# Patient Record
Sex: Female | Born: 1965
Health system: Southern US, Community
[De-identification: ages and names within clinical notes are randomized; demographics above are authoritative.]

## PROBLEM LIST (undated history)

## (undated) DIAGNOSIS — F909 Attention-deficit hyperactivity disorder, unspecified type: Secondary | ICD-10-CM

## (undated) DIAGNOSIS — S92902A Unspecified fracture of left foot, initial encounter for closed fracture: Secondary | ICD-10-CM

## (undated) DIAGNOSIS — F419 Anxiety disorder, unspecified: Secondary | ICD-10-CM

## (undated) DIAGNOSIS — F32A Depression, unspecified: Secondary | ICD-10-CM

## (undated) DIAGNOSIS — F329 Major depressive disorder, single episode, unspecified: Secondary | ICD-10-CM

## (undated) DIAGNOSIS — M1990 Unspecified osteoarthritis, unspecified site: Secondary | ICD-10-CM

## (undated) HISTORY — DX: Unspecified fracture of left foot, initial encounter for closed fracture: S92.902A

## (undated) HISTORY — PX: BLADDER SUSPENSION: SHX72

## (undated) HISTORY — PX: EYE SURGERY: SHX253

## (undated) HISTORY — PX: OTHER SURGICAL HISTORY: SHX169

## (undated) HISTORY — PX: BREAST SURGERY: SHX581

## (undated) HISTORY — DX: Attention-deficit hyperactivity disorder, unspecified type: F90.9

## (undated) HISTORY — DX: Depression, unspecified: F32.A

---
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
## 1997-08-12 ENCOUNTER — Other Ambulatory Visit: Admission: RE | Admit: 1997-08-12 | Discharge: 1997-08-12 | Payer: Self-pay | Admitting: Obstetrics and Gynecology

## 1998-08-19 ENCOUNTER — Other Ambulatory Visit: Admission: RE | Admit: 1998-08-19 | Discharge: 1998-08-19 | Payer: Self-pay | Admitting: Obstetrics and Gynecology

## 1999-08-26 ENCOUNTER — Other Ambulatory Visit: Admission: RE | Admit: 1999-08-26 | Discharge: 1999-08-26 | Payer: Self-pay | Admitting: Obstetrics and Gynecology

## 1999-12-01 ENCOUNTER — Encounter: Payer: Self-pay | Admitting: Family Medicine

## 1999-12-01 ENCOUNTER — Encounter: Admission: RE | Admit: 1999-12-01 | Discharge: 1999-12-01 | Payer: Self-pay | Admitting: Family Medicine

## 2000-08-30 ENCOUNTER — Other Ambulatory Visit: Admission: RE | Admit: 2000-08-30 | Discharge: 2000-08-30 | Payer: Self-pay | Admitting: Obstetrics and Gynecology

## 2001-08-23 ENCOUNTER — Other Ambulatory Visit: Admission: RE | Admit: 2001-08-23 | Discharge: 2001-08-23 | Payer: Self-pay | Admitting: Obstetrics and Gynecology

## 2005-04-20 ENCOUNTER — Encounter: Admission: RE | Admit: 2005-04-20 | Discharge: 2005-04-20 | Payer: Self-pay | Admitting: Obstetrics and Gynecology

## 2006-05-03 ENCOUNTER — Encounter: Admission: RE | Admit: 2006-05-03 | Discharge: 2006-05-03 | Payer: Self-pay | Admitting: Obstetrics and Gynecology

## 2007-05-22 ENCOUNTER — Encounter: Admission: RE | Admit: 2007-05-22 | Discharge: 2007-05-22 | Payer: Self-pay | Admitting: Obstetrics and Gynecology

## 2008-06-24 ENCOUNTER — Encounter: Admission: RE | Admit: 2008-06-24 | Discharge: 2008-06-24 | Payer: Self-pay | Admitting: Obstetrics and Gynecology

## 2009-07-17 ENCOUNTER — Encounter: Admission: RE | Admit: 2009-07-17 | Discharge: 2009-07-17 | Payer: Self-pay | Admitting: Obstetrics and Gynecology

## 2010-08-04 ENCOUNTER — Other Ambulatory Visit: Payer: Self-pay | Admitting: Obstetrics and Gynecology

## 2010-08-04 DIAGNOSIS — Z1231 Encounter for screening mammogram for malignant neoplasm of breast: Secondary | ICD-10-CM

## 2010-08-07 ENCOUNTER — Ambulatory Visit
Admission: RE | Admit: 2010-08-07 | Discharge: 2010-08-07 | Disposition: A | Discharge status: Home / Self Care | Payer: 59 | Source: Ambulatory Visit | Attending: Obstetrics and Gynecology | Admitting: Obstetrics and Gynecology

## 2010-08-07 DIAGNOSIS — Z1231 Encounter for screening mammogram for malignant neoplasm of breast: Secondary | ICD-10-CM

## 2010-12-01 ENCOUNTER — Ambulatory Visit: Admit: 2010-12-01 | Payer: Self-pay | Admitting: Obstetrics and Gynecology

## 2010-12-01 SURGERY — URETHROPEXY, USING TRANSVAGINAL TAPE

## 2011-01-12 ENCOUNTER — Ambulatory Visit (HOSPITAL_COMMUNITY): Admission: RE | Admit: 2011-01-12 | Payer: Self-pay | Source: Ambulatory Visit | Admitting: Obstetrics and Gynecology

## 2011-01-12 ENCOUNTER — Encounter (HOSPITAL_COMMUNITY): Admission: RE | Payer: Self-pay | Source: Ambulatory Visit

## 2011-01-12 SURGERY — URETHROPEXY, USING TRANSVAGINAL TAPE
Anesthesia: Choice

## 2011-06-22 ENCOUNTER — Encounter (HOSPITAL_COMMUNITY): Payer: Self-pay | Admitting: *Deleted

## 2011-06-30 ENCOUNTER — Other Ambulatory Visit: Payer: Self-pay | Admitting: Obstetrics and Gynecology

## 2011-07-21 ENCOUNTER — Encounter (HOSPITAL_COMMUNITY): Payer: Self-pay | Admitting: Pharmacist

## 2011-08-03 MED ORDER — CEFOTETAN DISODIUM 2 G IJ SOLR
2.0000 g | INTRAMUSCULAR | Status: AC
  Administered 2011-08-04: 2 g via INTRAVENOUS
  Filled 2011-08-03: qty 2

## 2011-08-03 NOTE — H&P (Signed)
46 y.o. yo complains of SUI.  Leaks with exercise and now with sneeze and cough.  Urine will run down leg.  She has occ urge but it is associated with increased H2O intake.  She does not need to splint.  Past Medical History  Diagnosis Date  . Arthritis     hands - tx with otc meds prn  . Anxiety    Past Surgical History  Procedure Date  . Cesarean section 1995  . Breast surgery     Right breast lumpectomy  . Wisom teeth ext   . Endosocpy     History   Social History  . Marital Status: Single    Spouse Name: N/A    Number of Children: N/A  . Years of Education: N/A   Occupational History  . Not on file.   Social History Main Topics  . Smoking status: Former Smoker -- 0.2 packs/day for 8 years    Types: Cigarettes    Quit date: 09/10/1992  . Smokeless tobacco: Never Used  . Alcohol Use: Yes     socially  . Drug Use: No  . Sexually Active: Not Currently    Birth Control/ Protection: None   Other Topics Concern  . Not on file   Social History Narrative  . No narrative on file    No current facility-administered medications on file prior to encounter.   Current Outpatient Prescriptions on File Prior to Encounter  Medication Sig Dispense Refill  . venlafaxine (EFFEXOR) 50 MG tablet Take 50 mg by mouth 2 (two) times daily.        No Known Allergies  Vital P  Lungs: clear to ascultation Cor:  RRR Abdomen:  soft, nontender, nondistended. Ex:  no cords, erythema Pelvic:  NEFG. Normal s/s uterus, 7 weeks mid.  No decensus.  Cystocele -3, Rectocele -3, urethra >45 degrees.  Cystometrics:  LPP 126, detrusor stable, normal flow and compliance.  A:  SUI- desires TVT.  Possible Arepair only if necessary. Pt is perimenopausal without period abnormalities and no decensus.  She desires to keep her uterus.   P:  All risks, benefits and alternatives d/w patient and she desires to proceed.  Patient will receive preop antibiotics and SCDs during the operation.      Alicia Seib A

## 2011-08-04 ENCOUNTER — Ambulatory Visit (HOSPITAL_COMMUNITY): Payer: 59 | Admitting: Anesthesiology

## 2011-08-04 ENCOUNTER — Encounter (HOSPITAL_COMMUNITY)
Admission: RE | Disposition: A | Discharge status: Home / Self Care | Payer: Self-pay | Source: Ambulatory Visit | Attending: Obstetrics and Gynecology

## 2011-08-04 ENCOUNTER — Encounter (HOSPITAL_COMMUNITY): Payer: Self-pay | Admitting: Anesthesiology

## 2011-08-04 ENCOUNTER — Ambulatory Visit (HOSPITAL_COMMUNITY)
Admission: RE | Admit: 2011-08-04 | Discharge: 2011-08-04 | Disposition: A | Discharge status: Home / Self Care | Payer: 59 | Source: Ambulatory Visit | Attending: Obstetrics and Gynecology | Admitting: Obstetrics and Gynecology

## 2011-08-04 DIAGNOSIS — N8111 Cystocele, midline: Secondary | ICD-10-CM | POA: Insufficient documentation

## 2011-08-04 DIAGNOSIS — N393 Stress incontinence (female) (male): Secondary | ICD-10-CM | POA: Insufficient documentation

## 2011-08-04 DIAGNOSIS — Z9889 Other specified postprocedural states: Secondary | ICD-10-CM

## 2011-08-04 HISTORY — PX: BLADDER SUSPENSION: SHX72

## 2011-08-04 HISTORY — DX: Unspecified osteoarthritis, unspecified site: M19.90

## 2011-08-04 HISTORY — PX: CYSTOSCOPY: SHX5120

## 2011-08-04 HISTORY — DX: Anxiety disorder, unspecified: F41.9

## 2011-08-04 LAB — CBC
MCV: 101 fL — ABNORMAL HIGH (ref 78.0–100.0)
Platelets: 340 10*3/uL (ref 150–400)
RBC: 4.06 MIL/uL (ref 3.87–5.11)
RDW: 13.5 % (ref 11.5–15.5)
WBC: 5.5 10*3/uL (ref 4.0–10.5)

## 2011-08-04 SURGERY — URETHROPEXY, USING TRANSVAGINAL TAPE
Anesthesia: General | Site: Vagina | Wound class: Clean Contaminated

## 2011-08-04 MED ORDER — EPHEDRINE 5 MG/ML INJ
INTRAVENOUS | Status: AC
  Filled 2011-08-04: qty 10

## 2011-08-04 MED ORDER — DEXAMETHASONE SODIUM PHOSPHATE 4 MG/ML IJ SOLN
INTRAMUSCULAR | Status: DC | PRN
  Administered 2011-08-04: 10 mg via INTRAVENOUS

## 2011-08-04 MED ORDER — ESTRADIOL 0.1 MG/GM VA CREA
TOPICAL_CREAM | VAGINAL | Status: AC
  Filled 2011-08-04: qty 42.5

## 2011-08-04 MED ORDER — DEXAMETHASONE SODIUM PHOSPHATE 10 MG/ML IJ SOLN
INTRAMUSCULAR | Status: AC
  Filled 2011-08-04: qty 1

## 2011-08-04 MED ORDER — FENTANYL CITRATE 0.05 MG/ML IJ SOLN
25.0000 ug | INTRAMUSCULAR | Status: DC | PRN
  Administered 2011-08-04: 50 ug via INTRAVENOUS
  Administered 2011-08-04: 25 ug via INTRAVENOUS

## 2011-08-04 MED ORDER — FENTANYL CITRATE 0.05 MG/ML IJ SOLN
INTRAMUSCULAR | Status: DC | PRN
  Administered 2011-08-04: 25 ug via INTRAVENOUS
  Administered 2011-08-04: 100 ug via INTRAVENOUS
  Administered 2011-08-04: 75 ug via INTRAVENOUS

## 2011-08-04 MED ORDER — LIDOCAINE HCL (CARDIAC) 20 MG/ML IV SOLN
INTRAVENOUS | Status: AC
  Filled 2011-08-04: qty 5

## 2011-08-04 MED ORDER — OXYCODONE-ACETAMINOPHEN 5-325 MG PO TABS
ORAL_TABLET | ORAL | Status: AC
  Filled 2011-08-04: qty 1

## 2011-08-04 MED ORDER — LIDOCAINE-EPINEPHRINE 0.5-1:200000 % IJ SOLN
INTRAMUSCULAR | Status: AC
  Filled 2011-08-04: qty 1

## 2011-08-04 MED ORDER — PROPOFOL 10 MG/ML IV EMUL
INTRAVENOUS | Status: AC
  Filled 2011-08-04: qty 20

## 2011-08-04 MED ORDER — MEPERIDINE HCL 25 MG/ML IJ SOLN: 6.2500 mg | INTRAMUSCULAR | Status: DC | PRN

## 2011-08-04 MED ORDER — GLYCOPYRROLATE 0.2 MG/ML IJ SOLN
INTRAMUSCULAR | Status: AC
  Filled 2011-08-04: qty 1

## 2011-08-04 MED ORDER — LACTATED RINGERS IV SOLN
INTRAVENOUS | Status: DC
  Administered 2011-08-04 (×2): via INTRAVENOUS

## 2011-08-04 MED ORDER — PROPOFOL 10 MG/ML IV EMUL
INTRAVENOUS | Status: DC | PRN
  Administered 2011-08-04: 200 mg via INTRAVENOUS

## 2011-08-04 MED ORDER — ONDANSETRON HCL 4 MG/2ML IJ SOLN
INTRAMUSCULAR | Status: AC
  Filled 2011-08-04: qty 2

## 2011-08-04 MED ORDER — KETOROLAC TROMETHAMINE 30 MG/ML IJ SOLN
INTRAMUSCULAR | Status: DC | PRN
  Administered 2011-08-04: 30 mg via INTRAVENOUS

## 2011-08-04 MED ORDER — OXYCODONE-ACETAMINOPHEN 5-325 MG PO TABS
ORAL_TABLET | ORAL | Status: AC
  Administered 2011-08-04: 1 via ORAL
  Filled 2011-08-04: qty 1

## 2011-08-04 MED ORDER — FENTANYL CITRATE 0.05 MG/ML IJ SOLN
INTRAMUSCULAR | Status: AC
  Filled 2011-08-04: qty 4

## 2011-08-04 MED ORDER — MIDAZOLAM HCL 5 MG/5ML IJ SOLN
INTRAMUSCULAR | Status: DC | PRN
  Administered 2011-08-04: 2 mg via INTRAVENOUS

## 2011-08-04 MED ORDER — STERILE WATER FOR IRRIGATION IR SOLN
Status: DC | PRN
  Administered 2011-08-04: 1000 mL

## 2011-08-04 MED ORDER — KETOROLAC TROMETHAMINE 30 MG/ML IJ SOLN: 15.0000 mg | Freq: Once | INTRAMUSCULAR | Status: DC | PRN

## 2011-08-04 MED ORDER — ESTRADIOL 0.1 MG/GM VA CREA
TOPICAL_CREAM | VAGINAL | Status: DC | PRN
  Administered 2011-08-04: 1 via VAGINAL

## 2011-08-04 MED ORDER — FENTANYL CITRATE 0.05 MG/ML IJ SOLN
INTRAMUSCULAR | Status: AC
  Administered 2011-08-04: 50 ug via INTRAVENOUS
  Filled 2011-08-04: qty 2

## 2011-08-04 MED ORDER — MIDAZOLAM HCL 2 MG/2ML IJ SOLN
INTRAMUSCULAR | Status: AC
  Filled 2011-08-04: qty 2

## 2011-08-04 MED ORDER — EPHEDRINE SULFATE 50 MG/ML IJ SOLN
INTRAMUSCULAR | Status: DC | PRN
  Administered 2011-08-04: 5 mg via INTRAVENOUS

## 2011-08-04 MED ORDER — INDIGOTINDISULFONATE SODIUM 8 MG/ML IJ SOLN
INTRAMUSCULAR | Status: DC | PRN
  Administered 2011-08-04: 40 mg via INTRAVENOUS

## 2011-08-04 MED ORDER — ONDANSETRON HCL 4 MG/2ML IJ SOLN: 4.0000 mg | Freq: Once | INTRAMUSCULAR | Status: DC | PRN

## 2011-08-04 MED ORDER — OXYCODONE-ACETAMINOPHEN 5-325 MG PO TABS
1.0000 | ORAL_TABLET | Freq: Once | ORAL | Status: AC
  Administered 2011-08-04: 1 via ORAL

## 2011-08-04 MED ORDER — OXYCODONE-ACETAMINOPHEN 5-325 MG PO TABS: 1.0000 | ORAL_TABLET | ORAL | Status: AC | PRN

## 2011-08-04 MED ORDER — LIDOCAINE-EPINEPHRINE 0.5-1:200000 % IJ SOLN
INTRAMUSCULAR | Status: DC | PRN
  Administered 2011-08-04: 7 mL

## 2011-08-04 MED ORDER — CEFUROXIME AXETIL 250 MG PO TABS: 250.0000 mg | ORAL_TABLET | Freq: Two times a day (BID) | ORAL | Status: AC

## 2011-08-04 MED ORDER — FENTANYL CITRATE 0.05 MG/ML IJ SOLN
INTRAMUSCULAR | Status: AC
  Filled 2011-08-04: qty 2

## 2011-08-04 MED ORDER — PHENAZOPYRIDINE HCL 200 MG PO TABS: 200.0000 mg | ORAL_TABLET | Freq: Three times a day (TID) | ORAL | Status: AC | PRN

## 2011-08-04 MED ORDER — INDIGOTINDISULFONATE SODIUM 8 MG/ML IJ SOLN
INTRAMUSCULAR | Status: AC
  Filled 2011-08-04: qty 5

## 2011-08-04 MED ORDER — ONDANSETRON HCL 4 MG/2ML IJ SOLN
INTRAMUSCULAR | Status: DC | PRN
  Administered 2011-08-04: 4 mg via INTRAVENOUS

## 2011-08-04 SURGICAL SUPPLY — 29 items
ADH SKN CLS APL DERMABOND .7 (GAUZE/BANDAGES/DRESSINGS) ×2
BLADE SURG 15 STRL LF C SS BP (BLADE) ×2 IMPLANT
BLADE SURG 15 STRL SS (BLADE) ×3
CANISTER SUCTION 2500CC (MISCELLANEOUS) ×4 IMPLANT
CATH BONANNO SUPRAPUBIC 14G (CATHETERS) IMPLANT
CLOTH BEACON ORANGE TIMEOUT ST (SAFETY) ×3 IMPLANT
DECANTER SPIKE VIAL GLASS SM (MISCELLANEOUS) ×1 IMPLANT
DERMABOND ADVANCED (GAUZE/BANDAGES/DRESSINGS) ×1
DERMABOND ADVANCED .7 DNX12 (GAUZE/BANDAGES/DRESSINGS) ×2 IMPLANT
GAUZE PACKING 2X5 YD STERILE (GAUZE/BANDAGES/DRESSINGS) ×3 IMPLANT
GLOVE BIO SURGEON STRL SZ7 (GLOVE) ×6 IMPLANT
GOWN PREVENTION PLUS LG XLONG (DISPOSABLE) ×12 IMPLANT
NEEDLE HYPO 22GX1.5 SAFETY (NEEDLE) ×3 IMPLANT
NS IRRIG 1000ML POUR BTL (IV SOLUTION) ×3 IMPLANT
PACK VAGINAL WOMENS (CUSTOM PROCEDURE TRAY) ×3 IMPLANT
PLUG CATH AND CAP STER (CATHETERS) ×3 IMPLANT
SET CYSTO W/LG BORE CLAMP LF (SET/KITS/TRAYS/PACK) ×3 IMPLANT
SLING TRANS VAGINAL TAPE (Sling) ×1 IMPLANT
SLING UTERINE/ABD GYNECARE TVT (Sling) ×2 IMPLANT
SPONGE SURGIFOAM ABS GEL 12-7 (HEMOSTASIS) ×2 IMPLANT
SUT VIC AB 0 CT1 27 (SUTURE) ×6
SUT VIC AB 0 CT1 27XBRD ANBCTR (SUTURE) ×4 IMPLANT
SUT VIC AB 2-0 SH 27 (SUTURE) ×9
SUT VIC AB 2-0 SH 27XBRD (SUTURE) ×6 IMPLANT
SUT VIC AB 2-0 UR6 27 (SUTURE) IMPLANT
SYR 50ML LL SCALE MARK (SYRINGE) IMPLANT
TOWEL OR 17X24 6PK STRL BLUE (TOWEL DISPOSABLE) ×6 IMPLANT
TRAY FOLEY CATH 14FR (SET/KITS/TRAYS/PACK) ×3 IMPLANT
WATER STERILE IRR 1000ML POUR (IV SOLUTION) ×3 IMPLANT

## 2011-08-04 NOTE — Transfer of Care (Signed)
Immediate Anesthesia Transfer of Care Note  Patient: Chelsea Velez  Procedure(s) Performed: Procedure(s) (LRB): TRANSVAGINAL TAPE (TVT) PROCEDURE (N/A) CYSTOSCOPY (N/A)  Patient Location: PACU  Anesthesia Type: General  Level of Consciousness: awake, alert  and oriented  Airway & Oxygen Therapy: Patient Spontanous Breathing and Patient connected to nasal cannula oxygen  Post-op Assessment: Report given to PACU RN and Post -op Vital signs reviewed and stable  Post vital signs: Reviewed and stable  Complications: No apparent anesthesia complications

## 2011-08-04 NOTE — Anesthesia Postprocedure Evaluation (Signed)
Anesthesia Post Note  Patient: Chelsea Velez  Procedure(s) Performed: Procedure(s) (LRB): TRANSVAGINAL TAPE (TVT) PROCEDURE (N/A) CYSTOSCOPY (N/A)  Anesthesia type: General  Patient location: PACU  Post pain: Pain level controlled  Post assessment: Post-op Vital signs reviewed  Last Vitals:  Filed Vitals:   08/04/11 1415  BP: 114/58  Pulse: 83  Temp:   Resp: 16    Post vital signs: Reviewed  Level of consciousness: sedated  Complications: No apparent anesthesia complicationsfj

## 2011-08-04 NOTE — Op Note (Signed)
08/04/2011  1:40 PM  PATIENT:  Chelsea Velez  46 y.o. female  PRE-OPERATIVE DIAGNOSIS:  Stress Urinary Incontinence, cystocele  POST-OPERATIVE DIAGNOSIS:  Stress Urinary Incontinence, cystocele  PROCEDURE:  Procedure(s) (LRB): TRANSVAGINAL TAPE (TVT) PROCEDURE (N/A), Anterior repair, CYSTOSCOPY (N/A)  SURGEON:  Surgeon(s) and Role:    * Loney Laurence, MD - Primary    * Miguel Aschoff, MD - Assisting  ANESTHESIA:   general  EBL:  Total I/O In: 1000 [I.V.:1000] Out: -   LOCAL MEDICATIONS USED:  LIDOCAINE   COUNTS:  YES  PLAN OF CARE: Discharge to home after PACU  PATIENT DISPOSITION:  PACU - hemodynamically stable.   Delay start of Pharmacological VTE agent (>24hrs) due to surgical blood loss or risk of bleeding: not applicable  Findings: Cystocele -1, uterus is well suspended.  Good spill of indigo carmine from bilateral ureteral orifices and the urethra was clear.  Complications:  Needles bilaterally punctured the bladder on the first pass by cystoscopy.  There were no needles in the bladder on the second pass and the puncture sites were hemostatic.    Meds:  Indigo carmine, lidocaine with epi and estrace cream.  After general anesthesia was administered, the patient was prepped and draped in the usual sterile fashion.  The uterus was well suspended and we elected to leave it in place.  A foley was placed in the urethra and the apex of the cystocele grasped with two allises.  Allises were used to grasp the vaginal mucosa in the midline superior to inferior just below the urethral opening.  The mucosa was injected with 1% lidocaine with epi in the midline and a scalpel used to incise the vaginal mucosa vertically.  Allises were used to retract the vaginal mucosa as the vesico-vaginal fascia was removed from the mucosa with blunt and sharp dissection and adequate room midurethral to pelvic floor bilaterally was developed.  Two small puncture incisions were then made two cm  from midline just above the pubic symphysis.  The abdominal needles from the [employer]ne[employer] TVT set were introduced behind the pubic bone, through the pelvic floor and out the vagina carefully on each respective side, being careful to hug the posterior of the pubic bone.  The foley was removed and indigo carmine given.  Cystoscopy revealed that the dome of the bladder was punctured by both needles and the needles were removed.  The abdominal needles were then replaced and cystoscopy performed again.  This time excellent bilateral spill from each ureteral orifice was seen and there were NO NEEDLES IN THE BLADDER.   The urethra was clear as well.  The foley was replaced and the vaginal needles attached to the abdominal needles.  The tape was pulled into place through the abdominal incisions, the sheaths removed while a kelly was in place under the mesh sling and the tape cut at the level of just below the skin.  The tape was checked and found to be tight enough and laying flat.   Gelfoam  was placed in the corners up by the pelvic floor to achieve hemostasis of some small bleeders out of reach and too close to the sling for stitches.  Once hemostasis was achieved, two mattress stitches of 0-vicryl were placed to close the vesico-vaginal fascia under the bladder.  The vaginal mucosa was trimmed and then closed with a running lock stitch of 2-0 vicryl.  A vaginal packing with estrace was placed and the patient returned to the recovery room in stable  condition.     The family and the patient were notified about the bladder puncture and the need for keeping the foley in place for at least one week.  Addison Whidbee A

## 2011-08-04 NOTE — Progress Notes (Signed)
Patient to be discharged with foley in place. Instructions given how to change big bag to leg bag and leg bag to big bag with understanding. Instructions given with understanding how to empty big bag. patient understands asceptic technique.

## 2011-08-04 NOTE — Anesthesia Procedure Notes (Signed)
Procedure Name: LMA Insertion Date/Time: 08/04/2011 12:43 PM Performed by: Joceline Hinchcliff, Jannet Askew Pre-anesthesia Checklist: Patient identified, Patient being monitored, Emergency Drugs available, Timeout performed and Suction available Patient Re-evaluated:Patient Re-evaluated prior to inductionOxygen Delivery Method: Circle system utilized Preoxygenation: Pre-oxygenation with 100% oxygen Intubation Type: IV induction Ventilation: Mask ventilation without difficulty LMA: LMA inserted LMA Size: 4.0 Number of attempts: 1 Placement Confirmation: positive ETCO2 and breath sounds checked- equal and bilateral Tube secured with: Tape (at lips)

## 2011-08-04 NOTE — Progress Notes (Signed)
There has been no change in the patients history, status or exam since the history and physical.  Filed Vitals:   06/22/11 0954 08/04/11 1048  BP:  121/54  Pulse:  83  Temp:  98.2 F (36.8 C)  TempSrc:  Oral  Resp:  18  Height: 5\' 7"  (1.702 m)   Weight: 56.7 kg (125 lb)   SpO2:  99%    Lab Results  Component Value Date   WBC 5.5 08/04/2011   HGB 13.4 08/04/2011   HCT 41.0 08/04/2011   MCV 101.0* 08/04/2011   PLT 340 08/04/2011    Ja Pistole A

## 2011-08-04 NOTE — Anesthesia Preprocedure Evaluation (Signed)
Anesthesia Evaluation  Patient identified by MRN, date of birth, ID band Patient awake    Reviewed: Allergy & Precautions, H&P , NPO status , Patient's Chart, lab work & pertinent test results  Airway Mallampati: I TM Distance: >3 FB Neck ROM: full    Dental No notable dental hx. (+) Teeth Intact   Pulmonary neg pulmonary ROS,    Pulmonary exam normal       Cardiovascular negative cardio ROS      Neuro/Psych negative neurological ROS  negative psych ROS   GI/Hepatic negative GI ROS, Neg liver ROS,   Endo/Other  negative endocrine ROS  Renal/GU negative Renal ROS  negative genitourinary   Musculoskeletal negative musculoskeletal ROS (+)   Abdominal Normal abdominal exam  (+)   Peds negative pediatric ROS (+)  Hematology negative hematology ROS (+)   Anesthesia Other Findings   Reproductive/Obstetrics negative OB ROS                           Anesthesia Physical Anesthesia Plan  ASA: II  Anesthesia Plan: General   Post-op Pain Management:    Induction: Intravenous  Airway Management Planned: LMA  Additional Equipment:   Intra-op Plan:   Post-operative Plan:   Informed Consent: I have reviewed the patients History and Physical, chart, labs and discussed the procedure including the risks, benefits and alternatives for the proposed anesthesia with the patient or authorized representative who has indicated his/her understanding and acceptance.     Plan Discussed with: CRNA and Surgeon  Anesthesia Plan Comments:         Anesthesia Quick Evaluation  

## 2011-08-04 NOTE — Brief Op Note (Signed)
08/04/2011  1:40 PM  PATIENT:  Chelsea Velez  46 y.o. female  PRE-OPERATIVE DIAGNOSIS:  Stress Urinary Incontinence, cystocele  POST-OPERATIVE DIAGNOSIS:  Stress Urinary Incontinence, cystocele  PROCEDURE:  Procedure(s) (LRB): TRANSVAGINAL TAPE (TVT) PROCEDURE (N/Velez), Anterior repair, CYSTOSCOPY (N/Velez)  SURGEON:  Surgeon(s) and Role:    * Loney Laurence, MD - Primary    * Miguel Aschoff, MD - Assisting  ANESTHESIA:   general  EBL:  Total I/O In: 1000 [I.V.:1000] Out: -   LOCAL MEDICATIONS USED:  LIDOCAINE   COUNTS:  YES  PLAN OF CARE: Discharge to home after PACU  PATIENT DISPOSITION:  PACU - hemodynamically stable.   Delay start of Pharmacological VTE agent (>24hrs) due to surgical blood loss or risk of bleeding: not applicable  Findings: Cystocele -1, uterus is well suspended.  Good spill of indigo carmine from bilateral ureteral orifices and the urethra was clear.  Complications:  Needles bilaterally punctured the bladder on the first pass by cystoscopy.  There were no needles in the bladder on the second pass and the puncture sites were hemostatic.    Meds:  Indigo carmine, lidocaine with epi and estrace cream.  After general anesthesia was administered, the patient was prepped and draped in the usual sterile fashion.  The uterus was well suspended and we elected to leave it in place.  Velez foley was placed in the urethra and the apex of the cystocele grasped with two allises.  Allises were used to grasp the vaginal mucosa in the midline superior to inferior just below the urethral opening.  The mucosa was injected with 1% lidocaine with epi in the midline and Velez scalpel used to incise the vaginal mucosa vertically.  Allises were used to retract the vaginal mucosa as the vesico-vaginal fascia was removed from the mucosa with blunt and sharp dissection and adequate room midurethral to pelvic floor bilaterally was developed.  Two small puncture incisions were then made two cm  from midline just above the pubic symphysis.  The abdominal needles from the Gynecare TVT set were introduced behind the pubic bone, through the pelvic floor and out the vagina carefully on each respective side, being careful to hug the posterior of the pubic bone.  The foley was removed and indigo carmine given.  Cystoscopy revealed that the dome of the bladder was punctured by both needles and the needles were removed.  The abdominal needles were then replaced and cystoscopy performed again.  This time excellent bilateral spill from each ureteral orifice was seen and there were NO NEEDLES IN THE BLADDER.   The urethra was clear as well.  The foley was replaced and the vaginal needles attached to the abdominal needles.  The tape was pulled into place through the abdominal incisions, the sheaths removed while Velez kelly was in place under the mesh sling and the tape cut at the level of just below the skin.  The tape was checked and found to be tight enough and laying flat.   Gelfoam  was placed in the corners up by the pelvic floor to achieve hemostasis of some small bleeders out of reach and too close to the sling for stitches.  Once hemostasis was achieved, two mattress stitches of 0-vicryl were placed to close the vesico-vaginal fascia under the bladder.  The vaginal mucosa was trimmed and then closed with Velez running lock stitch of 2-0 vicryl.  Velez vaginal packing with estrace was placed and the patient returned to the recovery room in stable  condition.     Chelsea Velez

## 2011-08-04 NOTE — Discharge Instructions (Signed)
Will need to keep foley in place for at least one week.  Take pyridium and antibiotics for that time.  Show patient how to change leg bag for big bag.  Will need to return to the office tomorrow am FOR REMOVAL OF VAG PACK.

## 2011-08-09 ENCOUNTER — Encounter (HOSPITAL_COMMUNITY): Payer: Self-pay | Admitting: Obstetrics and Gynecology

## 2011-10-18 ENCOUNTER — Other Ambulatory Visit: Payer: Self-pay | Admitting: Obstetrics and Gynecology

## 2011-10-18 DIAGNOSIS — Z1231 Encounter for screening mammogram for malignant neoplasm of breast: Secondary | ICD-10-CM

## 2011-11-01 ENCOUNTER — Ambulatory Visit
Admission: RE | Admit: 2011-11-01 | Discharge: 2011-11-01 | Disposition: A | Discharge status: Home / Self Care | Payer: 59 | Source: Ambulatory Visit | Attending: Obstetrics and Gynecology | Admitting: Obstetrics and Gynecology

## 2011-11-01 DIAGNOSIS — Z1231 Encounter for screening mammogram for malignant neoplasm of breast: Secondary | ICD-10-CM

## 2012-07-26 ENCOUNTER — Ambulatory Visit
Admission: RE | Admit: 2012-07-26 | Discharge: 2012-07-26 | Disposition: A | Discharge status: Home / Self Care | Payer: 59 | Source: Ambulatory Visit | Attending: Family Medicine | Admitting: Family Medicine

## 2012-07-26 ENCOUNTER — Other Ambulatory Visit: Payer: Self-pay | Admitting: Family Medicine

## 2012-07-26 DIAGNOSIS — R05 Cough: Secondary | ICD-10-CM

## 2012-10-11 ENCOUNTER — Other Ambulatory Visit: Payer: Self-pay

## 2012-10-11 DIAGNOSIS — Z1231 Encounter for screening mammogram for malignant neoplasm of breast: Secondary | ICD-10-CM

## 2012-11-30 ENCOUNTER — Ambulatory Visit
Admission: RE | Admit: 2012-11-30 | Discharge: 2012-11-30 | Disposition: A | Discharge status: Home / Self Care | Payer: 59 | Source: Ambulatory Visit

## 2012-11-30 DIAGNOSIS — Z1231 Encounter for screening mammogram for malignant neoplasm of breast: Secondary | ICD-10-CM

## 2014-02-05 ENCOUNTER — Other Ambulatory Visit: Payer: Self-pay | Admitting: Obstetrics and Gynecology

## 2014-02-20 ENCOUNTER — Ambulatory Visit (HOSPITAL_COMMUNITY): Admission: RE | Admit: 2014-02-20 | Payer: 59 | Source: Ambulatory Visit | Admitting: Obstetrics and Gynecology

## 2014-02-20 ENCOUNTER — Encounter (HOSPITAL_COMMUNITY): Admission: RE | Payer: Self-pay | Source: Ambulatory Visit

## 2014-02-20 SURGERY — DILATATION AND CURETTAGE /HYSTEROSCOPY
Anesthesia: Choice

## 2014-07-14 IMAGING — MG MM DIGITAL SCREENING W/ IMPLANTS
9 of 15 series · 9 of 39 positions shown · non-contrast
Comparison: Previous exam(s).

CLINICAL DATA: Screening.

[L CC (1 of 2)]
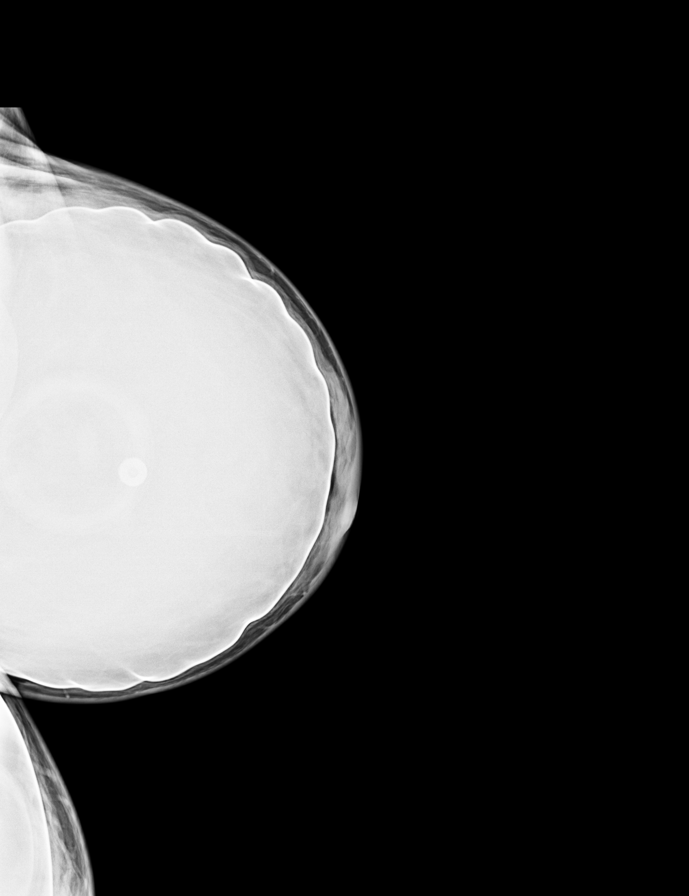

[L MLO (1 of 2)]
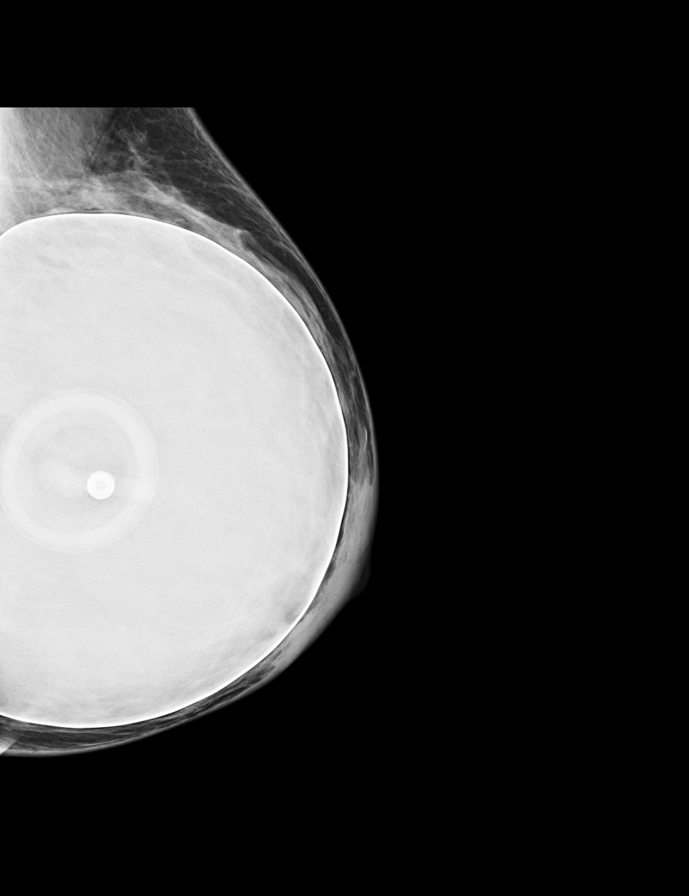

[R MLO (1 of 2)]
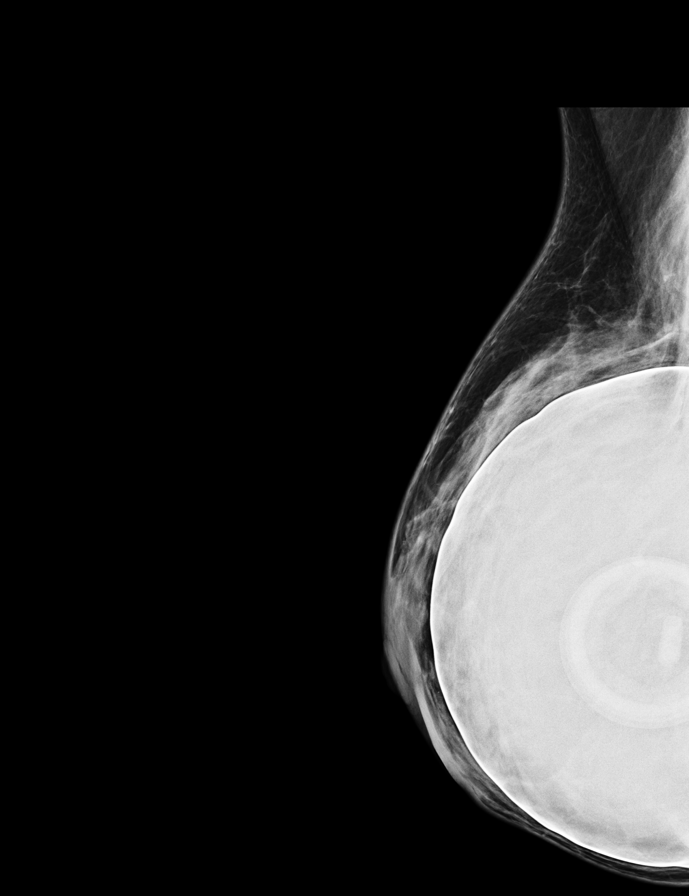

[R CC (1 of 3)]
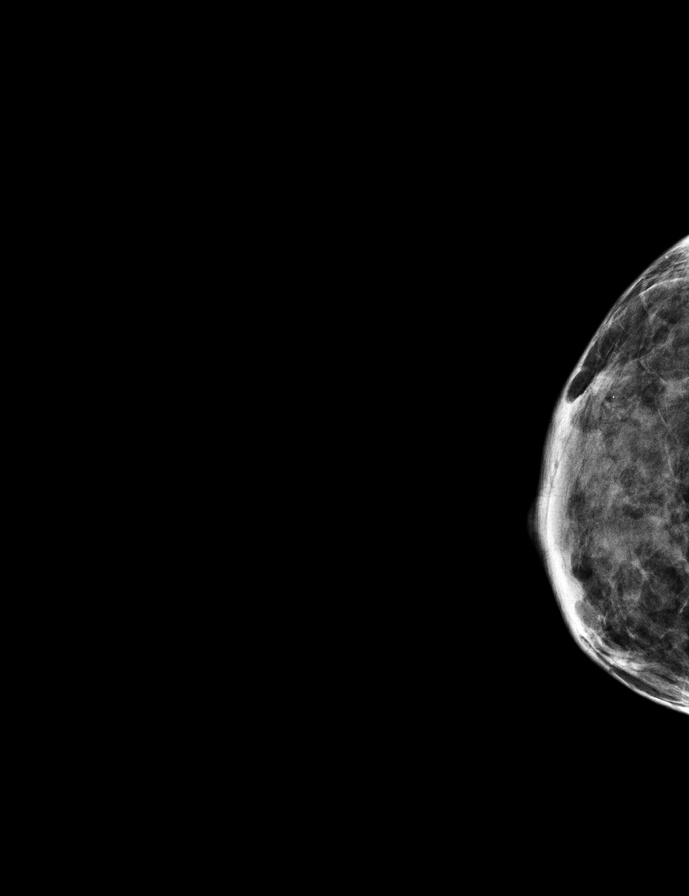

[R CC (2 of 3)]
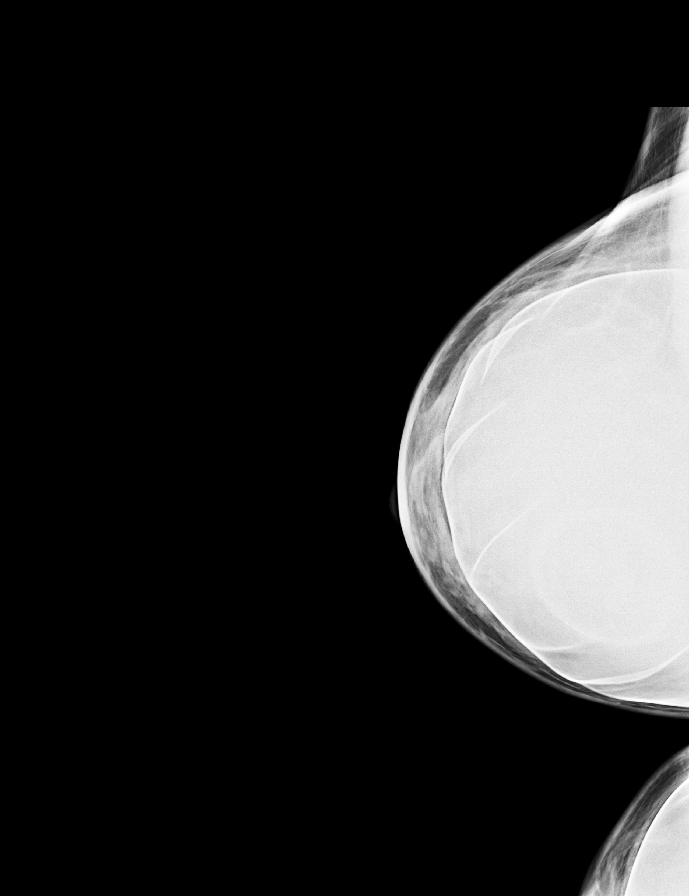

[R MLO (2 of 2)]
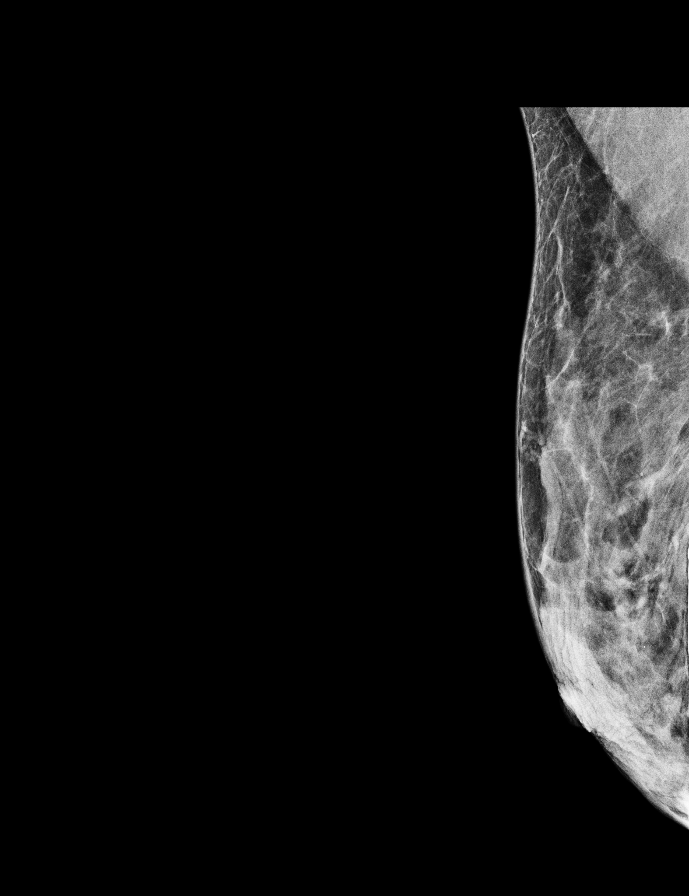

[L CC (2 of 2)]
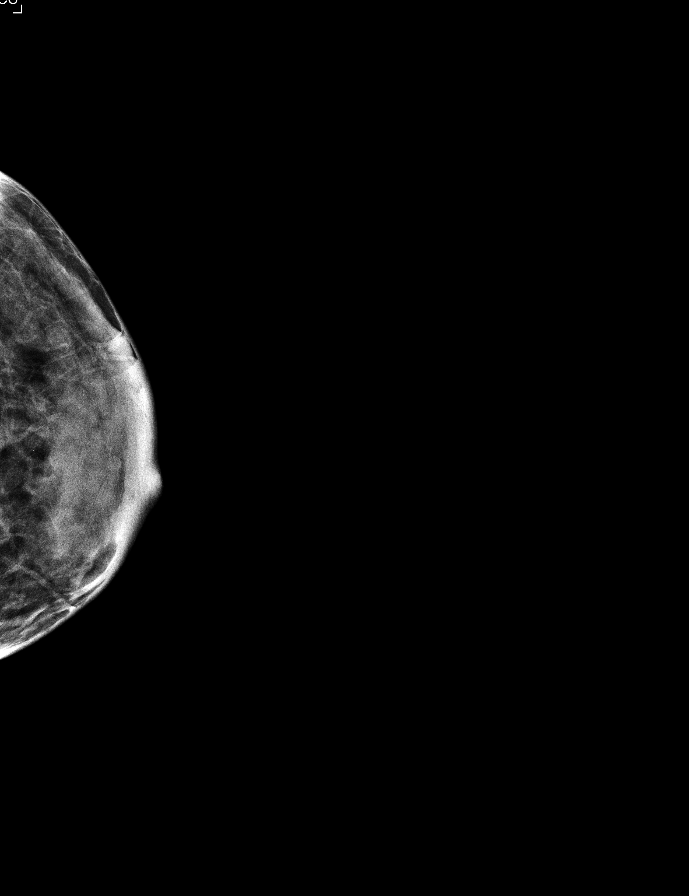

[R CC (3 of 3)]
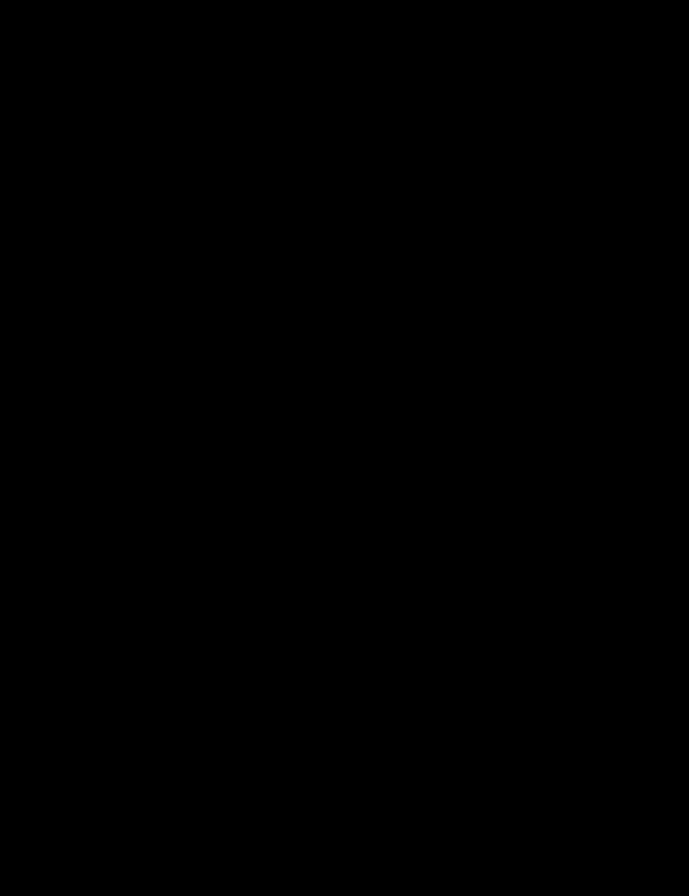

[L MLO (2 of 2)]
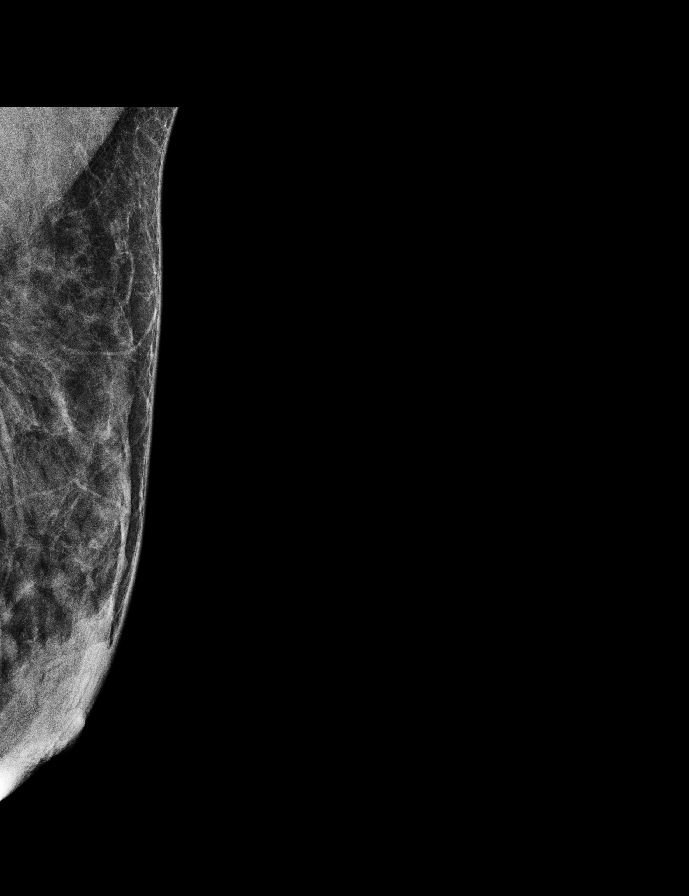

[9 of 39 positions shown; findings below may reference images not displayed]

DIGITAL SCREENING BILATERAL MAMMOGRAM WITH IMPLANTS AND CAD
DIGITAL BREAST TOMOSYNTHESIS

Digital breast tomosynthesis images are acquired in two
projections.  These images are reviewed in combination with the
digital mammogram, confirming the findings below.
The patient has subglandular saline implants. Standard and implant
displaced views were performed.
FINDINGS: ACR Breast Density Category c:  The breast tissue is
heterogeneously dense, which may obscure small masses.

There are no findings suspicious for malignancy.

Images were processed with CAD.
IMPRESSION: No mammographic evidence of malignancy.

A result letter of this screening mammogram will be mailed directly
to the patient.

RECOMMENDATION:
Screening mammogram in one year. (Code:IO-D-N1X)

BI-RADS CATEGORY 1:  Negative.

## 2019-09-17 ENCOUNTER — Other Ambulatory Visit: Payer: Self-pay

## 2019-09-17 ENCOUNTER — Ambulatory Visit: Payer: 59 | Admitting: Nurse Practitioner

## 2019-09-17 ENCOUNTER — Encounter: Payer: Self-pay | Admitting: Nurse Practitioner

## 2019-09-17 VITALS — BP 131/74 | HR 91 | Temp 98.3°F | Ht 66.5 in | Wt 123.0 lb

## 2019-09-17 DIAGNOSIS — Z7689 Persons encountering health services in other specified circumstances: Secondary | ICD-10-CM

## 2019-09-17 DIAGNOSIS — F900 Attention-deficit hyperactivity disorder, predominantly inattentive type: Secondary | ICD-10-CM

## 2019-09-17 DIAGNOSIS — M542 Cervicalgia: Secondary | ICD-10-CM | POA: Diagnosis not present

## 2019-09-17 DIAGNOSIS — S20161A Insect bite (nonvenomous) of breast, right breast, initial encounter: Secondary | ICD-10-CM

## 2019-09-17 DIAGNOSIS — W57XXXA Bitten or stung by nonvenomous insect and other nonvenomous arthropods, initial encounter: Secondary | ICD-10-CM

## 2019-09-17 MED ORDER — DOXYCYCLINE HYCLATE 100 MG PO TABS
100.0000 mg | ORAL_TABLET | Freq: Two times a day (BID) | ORAL | 0 refills | Status: DC
Start: 2019-09-17 — End: 2019-10-04

## 2019-09-17 MED ORDER — TIZANIDINE HCL 2 MG PO CAPS: 2.0000 mg | ORAL_CAPSULE | Freq: Three times a day (TID) | ORAL | 0 refills | Status: DC

## 2019-09-17 NOTE — Progress Notes (Signed)
BP 131/74   Pulse 91   Temp 98.3 F (36.8 C) (Oral)   Ht 5' 6.5" (1.689 m)   Wt 123 lb (55.8 kg)   LMP 10/25/2011   SpO2 97%   BMI 19.56 kg/m    Subjective:    Patient ID: Chelsea Velez, female    DOB: 1965-04-27, 54 y.o.   MRN: [id number]  HPI: Chelsea Velez is a 54 y.o. female presenting for new patient visit to establish care.  Introduced to Designer, [employer]ellery role and practice setting.  All questions answered.  Discussed provider/patient relationship and expectations.  Chief Complaint  Patient presents with  . Establish Care    pt would like to discus about a tick bite and right hand numbness   ADHD  Patient states she used to see an Attention Specialist in [place], however recently she has not been able to get an appointment with the provider that she feels most comfortable with.  She was working with this provider to slowly decrease the amount of stimulants taken each day.  At her last visit, she began taking Adderall Xr 30 mg once daily (instead of twice daily) and taking dextroamphetamine 20 mg once daily.  She would like to continue cutting back if possible and feels that this combination of medication is working best for her right now. Satisfied with current therapy: yes Medication compliance:  excellent compliance Controlled substance contract: needs to be done at next appointment Previous psychiatry evaluation: yes Previous medications: adderall,    Taking meds on weekends/vacations: no Work/school performance:  excellent Difficulty sustaining attention/completing tasks: yes Distracted by extraneous stimuli: yes Does not listen when spoken to: yes  Fidgets with hands or feet: yes Unable to stay in seat: yes Blurts out/interrupts others: no ADHD Medication Side Effects: yes    Decreased appetite: yes    Headache: no    Sleeping disturbance pattern: no    Irritability: no    Rebound effects (worse than baseline) off medication: no    Anxiousness: yes  Dizziness: no    Tics: no  NECK PAIN Neck pain started years ago with sharp electric-like sensation in her right scapula/lower neck area.  The pain is worse when gardening or lifting heavy objects.  She has not tried anything for this pain, she was very nervous to seek care with a new primary care provider. Status: uncontrolled Treatments attempted: rest, ben gay Relief with NSAIDs?:  No NSAIDs Taken Location: right Duration:years - 2 Severity: severe Quality: electric feeling Frequency: daily Radiation: R arm Aggravating factors: lifting, pushing on it Alleviating factors: rest, ben gay Weakness:  yes Paresthesias / decreased sensation:  yes  Fevers:  no  HAND NUMBNESS Patient states that since the neck pain started, her right hand and arm has been affected and has been getting weaker and sometimes tingles and is numb. Duration: months Involved hand: right Mechanism of injury: no Location: diffuse Onset: sudden Severity: mild Quality: tingling Frequency: daily Radiation: no Aggravating factors: sleeping, overuse Alleviating factors: nothing Treatments attempted: hand exercises Relief with NSAIDs?: No NSAIDs Taken Weakness: yes Numbness: yes Redness: no Swelling:no Bruising: no Fevers: no  TICK BITE May 11th, states she got a tick bite during a new home walkthrough.  She has been doing research on the internet and also states her friends and family members have encouraged her to get checked out for this. Duration: months Location: under right breast Onset: sudden Itching: yes Status: better Treatments attempted:  Fever: no Chills: no  Decreased appetite: yes headache: no Muscle pain: no Rash: yes  No Known Allergies  Outpatient Encounter Medications as of 09/17/2019  Medication Sig  . amphetamine-dextroamphetamine (ADDERALL XR) 30 MG 24 hr capsule Adderall XR 30 mg capsule,extended release  TAKE 1 CAPSULE BY MOUTH TWICE A DAY  . sertraline (ZOLOFT) 100 MG  tablet sertraline 100 mg tablet  TAKE 1 TABLET BY MOUTH EVERY DAY  . valACYclovir (VALTREX) 500 MG tablet Take by mouth. As needed  . doxycycline (VIBRA-TABS) 100 MG tablet Take 1 tablet (100 mg total) by mouth 2 (two) times daily.  . tizanidine (ZANAFLEX) 2 MG capsule Take 1 capsule (2 mg total) by mouth 3 (three) times daily. Take first dose at night and monitor for drowsiness.  . [DISCONTINUED] cholecalciferol (VITAMIN D) 1000 UNITS tablet Take 1,000 Units by mouth every morning.  . [DISCONTINUED] Melatonin 5 MG TABS Take 1 tablet by mouth at bedtime.  . [DISCONTINUED] Multiple Vitamin (MULITIVITAMIN WITH MINERALS) TABS Take 1 tablet by mouth daily.  . [DISCONTINUED] venlafaxine (EFFEXOR) 50 MG tablet Take 50 mg by mouth 2 (two) times daily.   No facility-administered encounter medications on file as of 09/17/2019.   Active Ambulatory Problems    Diagnosis Date Noted  . Neck pain 09/18/2019  . Insect bite of right breast 09/18/2019  . Attention deficit hyperactivity disorder (ADHD), predominantly inattentive type 09/18/2019   Resolved Ambulatory Problems    Diagnosis Date Noted  . No Resolved Ambulatory Problems   Past Medical History:  Diagnosis Date  . ADHD   . Anxiety   . Arthritis   . Depression    Past Medical History:  Diagnosis Date  . ADHD   . Anxiety   . Arthritis    hands - tx with otc meds prn  . Depression    Family History  Problem Relation Age of Onset  . Cervical cancer Mother   . Breast cancer Mother   . Diabetes Mother   . Hypertension Mother   . Clotting disorder Mother    Past Surgical History:  Procedure Laterality Date  . BLADDER SUSPENSION  08/04/2011   Procedure: TRANSVAGINAL TAPE (TVT) PROCEDURE;  Surgeon: Daria Pastures, MD;  Location: [facility];  Service: Gynecology;  Laterality: N/A;  . BREAST SURGERY     Right breast lumpectomy  . CESAREAN SECTION  1995  . CYSTOSCOPY  08/04/2011   Procedure: CYSTOSCOPY;  Surgeon: Daria Pastures, MD;   Location: [facility];  Service: Gynecology;  Laterality: N/A;  . endosocpy    . EYE SURGERY    . Wisom Teeth Ext     Social History   Tobacco Use  . Smoking status: Former Smoker    Packs/day: 0.25    Years: 8.00    Pack years: 2.00    Types: Cigarettes    Quit date: 09/10/1992    Years since quitting: 27.0  . Smokeless tobacco: Never Used  Substance Use Topics  . Alcohol use: Yes    Comment: socially  . Drug use: No    Review of Systems  Constitutional: Positive for appetite change. Negative for chills and fever.  Respiratory: Negative.  Negative for cough, chest tightness, shortness of breath and wheezing.   Cardiovascular: Negative.  Negative for chest pain, palpitations and leg swelling.  Musculoskeletal: Negative for arthralgias and myalgias.  Skin: Positive for rash. Negative for color change, pallor and wound.  Neurological: Positive for weakness and numbness. Negative for headaches.  Psychiatric/Behavioral: Positive for decreased concentration. Negative  for behavioral problems, confusion and sleep disturbance. The patient is hyperactive. The patient is not nervous/anxious.    Per HPI unless specifically indicated above     Objective:    BP 131/74   Pulse 91   Temp 98.3 F (36.8 C) (Oral)   Ht 5' 6.5" (1.689 m)   Wt 123 lb (55.8 kg)   LMP 10/25/2011   SpO2 97%   BMI 19.56 kg/m   Wt Readings from Last 3 Encounters:  09/17/19 123 lb (55.8 kg)  06/22/11 125 lb (56.7 kg)    Physical Exam Vitals and nursing note reviewed.  Constitutional:      General: She is not in acute distress.    Appearance: Normal appearance. She is not toxic-appearing.  Eyes:     General: No scleral icterus.    Extraocular Movements: Extraocular movements intact.  Neck:     Vascular: No carotid bruit.  Cardiovascular:     Rate and Rhythm: Normal rate and regular rhythm.     Pulses: Normal pulses.     Heart sounds: No murmur.  Pulmonary:     Effort: Pulmonary effort is normal. No  respiratory distress.     Breath sounds: Normal breath sounds. No wheezing, rhonchi or rales.  Abdominal:     General: Abdomen is flat. Bowel sounds are normal.     Palpations: Abdomen is soft.  Musculoskeletal:        General: Normal range of motion.     Right shoulder: Tenderness present. No bony tenderness or crepitus. Normal range of motion. Normal strength.     Left shoulder: Normal.     Right upper arm: Normal.     Left upper arm: Normal.     Right wrist: Normal pulse.     Left wrist: Normal pulse.     Cervical back: Normal range of motion. No rigidity.     Right lower leg: No edema.     Left lower leg: No edema.  Lymphadenopathy:     Cervical: No cervical adenopathy.  Skin:    General: Skin is warm and dry.     Capillary Refill: Capillary refill takes less than 2 seconds.     Coloration: Skin is not jaundiced or pale.     Findings: Lesion present.       Neurological:     Mental Status: She is alert.       Assessment & Plan:   Problem List Items Addressed This Visit      Musculoskeletal and Integument   Insect bite of right breast    Acute, ongoing.  Will treat empirically with doxycycline given known tick bite and history of rash to area.  RMSF and lyme disease labs checked today, will follow.      Relevant Medications   doxycycline (VIBRA-TABS) 100 MG tablet   Other Relevant Orders   Rocky mtn spotted fvr abs pnl(IgG+IgM)   Lyme disease, western blot     Other   Neck pain    Chronic, ongoing.  No red flags on examination; likely due to musculoskeletal strain.  Will treat conservatively for now with muscle relaxant and obtain x-ray imaging.  If not better in 2-4 weeks, may consider steroids and/or physical therapy.  Handout given for stretches.      Relevant Medications   tizanidine (ZANAFLEX) 2 MG capsule   Other Relevant Orders   DG Cervical Spine Complete   DG Thoracic Spine W/Swimmers   Attention deficit hyperactivity disorder (ADHD), predominantly  inattentive type -  Primary    Chronic, ongoing.  Patient with previous work-up and treatment and does not wish to follow with Specialist any longer.  Discussed office policy, cannot prescribe controlled substances on first visit.  Will schedule follow up for 2-4 weeks and discuss treatment options further.         Other Visit Diagnoses    Encounter to establish care           Follow up plan: Return in about 2 weeks (around 10/01/2019) for ADHD follow up.

## 2019-09-17 NOTE — Patient Instructions (Addendum)
Nice meeting you today, Chelsea Velez!  Take care and please let me know if you need anything.  Please get your xray at: [address], [place] [address].    Neck Exercises Ask your health care provider which exercises are safe for you. Do exercises exactly as told by your health care provider and adjust them as directed. It is normal to feel mild stretching, pulling, tightness, or discomfort as you do these exercises. Stop right away if you feel sudden pain or your pain gets worse. Do not begin these exercises until told by your health care provider. Neck exercises can be important for many reasons. They can improve strength and maintain flexibility in your neck, which will help your upper back and prevent neck pain. Stretching exercises Rotation neck stretching  1. Sit in a chair or stand up. 2. Place your feet flat on the floor, shoulder width apart. 3. Slowly turn your head (rotate) to the right until a slight stretch is felt. Turn it all the way to the right so you can look over your right shoulder. Do not tilt or tip your head. 4. Hold this position for 10-30 seconds. 5. Slowly turn your head (rotate) to the left until a slight stretch is felt. Turn it all the way to the left so you can look over your left shoulder. Do not tilt or tip your head. 6. Hold this position for 10-30 seconds. Repeat __________ times. Complete this exercise __________ times a day. Neck retraction 1. Sit in a sturdy chair or stand up. 2. Look straight ahead. Do not bend your neck. 3. Use your fingers to push your chin backward (retraction). Do not bend your neck for this movement. Continue to face straight ahead. If you are doing the exercise properly, you will feel a slight sensation in your throat and a stretch at the back of your neck. 4. Hold the stretch for 1-2 seconds. Repeat __________ times. Complete this exercise __________ times a day. Strengthening exercises Neck press 1. Lie on your back on a firm bed or on  the floor with a pillow under your head. 2. Use your neck muscles to push your head down on the pillow and straighten your spine. 3. Hold the position as well as you can. Keep your head facing up (in a neutral position) and your chin tucked. 4. Slowly count to 5 while holding this position. Repeat __________ times. Complete this exercise __________ times a day. Isometrics These are exercises in which you strengthen the muscles in your neck while keeping your neck still (isometrics). 1. Sit in a supportive chair and place your hand on your forehead. 2. Keep your head and face facing straight ahead. Do not flex or extend your neck while doing isometrics. 3. Push forward with your head and neck while pushing back with your hand. Hold for 10 seconds. 4. Do the sequence again, this time putting your hand against the back of your head. Use your head and neck to push backward against the hand pressure. 5. Finally, do the same exercise on either side of your head, pushing sideways against the pressure of your hand. Repeat __________ times. Complete this exercise __________ times a day. Prone head lifts 1. Lie face-down (prone position), resting on your elbows so that your chest and upper back are raised. 2. Start with your head facing downward, near your chest. Position your chin either on or near your chest. 3. Slowly lift your head upward. Lift until you are looking  straight ahead. Then continue lifting your head as far back as you can comfortably stretch. 4. Hold your head up for 5 seconds. Then slowly lower it to your starting position. Repeat __________ times. Complete this exercise __________ times a day. Supine head lifts 1. Lie on your back (supine position), bending your knees to point to the ceiling and keeping your feet flat on the floor. 2. Lift your head slowly off the floor, raising your chin toward your chest. 3. Hold for 5 seconds. Repeat __________ times. Complete this exercise  __________ times a day. Scapular retraction 1. Stand with your arms at your sides. Look straight ahead. 2. Slowly pull both shoulders (scapulae) backward and downward (retraction) until you feel a stretch between your shoulder blades in your upper back. 3. Hold for 10-30 seconds. 4. Relax and repeat. Repeat __________ times. Complete this exercise __________ times a day. Contact a health care provider if:  Your neck pain or discomfort gets much worse when you do an exercise.  Your neck pain or discomfort does not improve within 2 hours after you exercise. If you have any of these problems, stop exercising right away. Do not do the exercises again unless your health care provider says that you can. Get help right away if:  You develop sudden, severe neck pain. If this happens, stop exercising right away. Do not do the exercises again unless your health care provider says that you can. This information is not intended to replace advice given to you by your health care provider. Make sure you discuss any questions you have with your health care provider. Document Revised: 01/25/2018 Document Reviewed: 01/25/2018 Elsevier Patient Education  [place].

## 2019-09-18 DIAGNOSIS — W57XXXA Bitten or stung by nonvenomous insect and other nonvenomous arthropods, initial encounter: Secondary | ICD-10-CM | POA: Insufficient documentation

## 2019-09-18 DIAGNOSIS — F900 Attention-deficit hyperactivity disorder, predominantly inattentive type: Secondary | ICD-10-CM | POA: Insufficient documentation

## 2019-09-18 DIAGNOSIS — M542 Cervicalgia: Secondary | ICD-10-CM | POA: Insufficient documentation

## 2019-09-18 NOTE — Assessment & Plan Note (Signed)
Chronic, ongoing.  Patient with previous work-up and treatment and does not wish to follow with Specialist any longer.  Discussed office policy, cannot prescribe controlled substances on first visit.  Will schedule follow up for 2-4 weeks and discuss treatment options further.

## 2019-09-18 NOTE — Assessment & Plan Note (Signed)
Acute, ongoing.  Will treat empirically with doxycycline given known tick bite and history of rash to area.  RMSF and lyme disease labs checked today, will follow.

## 2019-09-18 NOTE — Assessment & Plan Note (Signed)
Chronic, ongoing.  No red flags on examination; likely due to musculoskeletal strain.  Will treat conservatively for now with muscle relaxant and obtain x-ray imaging.  If not better in 2-4 weeks, may consider steroids and/or physical therapy.  Handout given for stretches.

## 2019-09-24 LAB — LYME DISEASE, WESTERN BLOT
IgG P18 Ab.: ABSENT
IgG P23 Ab.: ABSENT
IgG P28 Ab.: ABSENT
IgG P30 Ab.: ABSENT
IgG P39 Ab.: ABSENT
IgG P45 Ab.: ABSENT
IgG P66 Ab.: ABSENT
IgG P93 Ab.: ABSENT
IgM P23 Ab.: ABSENT
IgM P39 Ab.: ABSENT
IgM P41 Ab.: ABSENT
Lyme IgG Wb: NEGATIVE
Lyme IgM Wb: NEGATIVE

## 2019-09-24 LAB — ROCKY MTN SPOTTED FVR ABS PNL(IGG+IGM)
RMSF IgG: NEGATIVE
RMSF IgM: 0.16 index (ref 0.00–0.89)

## 2019-10-04 ENCOUNTER — Telehealth: Payer: Self-pay | Admitting: Nurse Practitioner

## 2019-10-04 ENCOUNTER — Ambulatory Visit
Admission: RE | Admit: 2019-10-04 | Discharge: 2019-10-04 | Disposition: A | Discharge status: Home / Self Care | Payer: 59 | Source: Ambulatory Visit | Attending: Nurse Practitioner | Admitting: Nurse Practitioner

## 2019-10-04 ENCOUNTER — Ambulatory Visit (INDEPENDENT_AMBULATORY_CARE_PROVIDER_SITE_OTHER): Payer: 59 | Admitting: Nurse Practitioner

## 2019-10-04 ENCOUNTER — Other Ambulatory Visit: Payer: Self-pay

## 2019-10-04 ENCOUNTER — Encounter: Payer: Self-pay | Admitting: Nurse Practitioner

## 2019-10-04 ENCOUNTER — Other Ambulatory Visit: Payer: Self-pay | Admitting: Nurse Practitioner

## 2019-10-04 VITALS — BP 110/61 | HR 75 | Temp 98.0°F | Ht 66.5 in | Wt 123.0 lb

## 2019-10-04 DIAGNOSIS — F339 Major depressive disorder, recurrent, unspecified: Secondary | ICD-10-CM | POA: Diagnosis not present

## 2019-10-04 DIAGNOSIS — Z1211 Encounter for screening for malignant neoplasm of colon: Secondary | ICD-10-CM

## 2019-10-04 DIAGNOSIS — M542 Cervicalgia: Secondary | ICD-10-CM

## 2019-10-04 DIAGNOSIS — M549 Dorsalgia, unspecified: Secondary | ICD-10-CM | POA: Diagnosis present

## 2019-10-04 DIAGNOSIS — Z79899 Other long term (current) drug therapy: Secondary | ICD-10-CM

## 2019-10-04 DIAGNOSIS — F900 Attention-deficit hyperactivity disorder, predominantly inattentive type: Secondary | ICD-10-CM

## 2019-10-04 MED ORDER — DEXTROAMPHETAMINE SULFATE 20 MG PO TABS
20.0000 mg | ORAL_TABLET | Freq: Every day | ORAL | 0 refills | Status: DC
Start: 2019-11-03 — End: 2019-10-05

## 2019-10-04 MED ORDER — DEXTROAMPHETAMINE SULFATE 20 MG PO TABS: 20.0000 mg | ORAL_TABLET | Freq: Every day | ORAL | 0 refills | Status: DC

## 2019-10-04 MED ORDER — DEXTROAMPHETAMINE SULFATE 20 MG PO TABS: 10.0000 mg | ORAL_TABLET | Freq: Every day | ORAL | 0 refills | Status: DC

## 2019-10-04 NOTE — Telephone Encounter (Signed)
Copied from [place] [id number]. Topic: General - Other >> Oct 04, 2019  3:33 PM Yvette Rack wrote: Reason for CRM: [facility] requests that the Rx for dextroamphetamine 20 MG TABS dated for 12/03/2019 be resubmitted due to instructions state:   Take 10 mg by mouth daily but it should be for 20 MG.

## 2019-10-04 NOTE — Telephone Encounter (Signed)
Routing to provider  

## 2019-10-04 NOTE — Progress Notes (Signed)
BP 110/61 (BP Location: Left Arm, Patient Position: Sitting, Cuff Size: Normal)   Pulse 75   Temp 98 F (36.7 C) (Oral)   Ht 5' 6.5" (1.689 m)   Wt 123 lb (55.8 kg)   LMP 10/25/2011   SpO2 96%   BMI 19.56 kg/m    Subjective:    Patient ID: Chelsea Velez, female    DOB: 1965/04/20, 54 y.o.   MRN: [id number]  HPI: Chelsea Velez is a 54 y.o. female presenting with neck pain and ADHD follow up.  Chief Complaint  Patient presents with  . ADHD  . Neck Pain  . Fall    Patient states she fell 3 times since last seen.    NECK PAIN Patient reports that she was not able to get the x-rays ordered from the last visit due to confusion about where to go, but plans to get them done today.  She was taking the muscle relaxer and it was helping with her neck pain, but feels it makes her constipated.  Since our last visit, she has fallen down a set of stairs as well as a couple of other times.  She reports that she does not get dizzy and the falls were not associated with the muscle relaxer; they were more related to her rushing and not taking her time. Status: uncontrolled Treatments attempted: rest, ben gay, muscle relaxer   Compliant with recommended treatment: yes Relief with NSAIDs?:  No NSAIDs Taken Location:r right upper back/lower neck Duration:years - 2 Severity: severe Quality: electric feeling Frequency: daily Radiation: right ear Aggravating factors: lifting, bending/reaching when gardening Alleviating factors: muscle relaxer,  Ben gay Weakness:  yes Paresthesias / decreased sensation:  yes  Fevers:  no  ADHD Patient's long-term goal is to take one Adderall daily.  She is currently cutting the dextroamphetamine in half and taking it in the afternoon and taking 1 Adderall once daily in the morning.  She states when she takes Adderall twice per day, it keeps her awake for too long and then she cannot sleep. ADHD status: controlled Satisfied with current therapy:  yes Medication compliance:  excellent compliance Controlled substance contract: yes - signed today Previous psychiatry evaluation: yes Previous medications: yes - adderall and dextroamphetamine    Taking meds on weekends/vacations: no Work/school performance:  good Difficulty sustaining attention/completing tasks: yes Distracted by extraneous stimuli: yes Does not listen when spoken to: no  Fidgets with hands or feet: yes Unable to stay in seat: yes Blurts out/interrupts others: no ADHD Medication Side Effects: no    Decreased appetite: no    Headache: no    Sleeping disturbance pattern: yes    Irritability: no    Rebound effects (worse than baseline) off medication: no    Anxiousness: yes    Dizziness: no    Tics: no  MOOD Takes Sertraline 100 mg daily.  She has tried to get off of it in the past and has not done well trying to come off of it.  Feels it really helps with her depression. Duration:controlled Anxious mood: yes  Excessive worrying: yes Irritability: yes  Sweating: no Nausea: no Palpitations:no Hyperventilation: no Panic attacks: no Agoraphobia: yes  Obscessions/compulsions: no Depressed mood: no Depression screen PHQ 2/9 09/17/2019  Decreased Interest 0  Down, Depressed, Hopeless 0  PHQ - 2 Score 0  Altered sleeping 2  Tired, decreased energy 1  Change in appetite 1  Feeling bad or failure about yourself  0  Trouble concentrating  0  Moving slowly or fidgety/restless 0  Suicidal thoughts 0  PHQ-9 Score 4  Difficult doing work/chores Somewhat difficult   GAD 7 : Generalized Anxiety Score 09/17/2019  Nervous, Anxious, on Edge 2  Control/stop worrying 0  Worry too much - different things 0  Trouble relaxing 2  Restless 3  Easily annoyed or irritable 1  Afraid - awful might happen 1  Total GAD 7 Score 9  Anxiety Difficulty Somewhat difficult   Anhedonia: no Weight changes: no Insomnia: yes if takes 2 Adderall   Hypersomnia: no Fatigue/loss of  energy: no Feelings of worthlessness: no Feelings of guilt: no Impaired concentration/indecisiveness: yes Suicidal ideations: no  Crying spells: no Recent Stressors/Life Changes: no   Relationship problems: no   Family stress: no     Financial stress: no    Job stress: no    Recent death/loss: no  No Known Allergies  Outpatient Encounter Medications as of 10/04/2019  Medication Sig  . amphetamine-dextroamphetamine (ADDERALL XR) 30 MG 24 hr capsule Adderall XR 30 mg capsule,extended release  TAKE 1 CAPSULE BY MOUTH TWICE A DAY  . sertraline (ZOLOFT) 100 MG tablet sertraline 100 mg tablet  TAKE 1 TABLET BY MOUTH EVERY DAY  . valACYclovir (VALTREX) 500 MG tablet Take by mouth. As needed  . dextroamphetamine 20 MG TABS Take 20 mg by mouth daily.  Derrill Memo ON 11/03/2019] dextroamphetamine 20 MG TABS Take 20 mg by mouth daily.  . tizanidine (ZANAFLEX) 2 MG capsule Take 1 capsule (2 mg total) by mouth 3 (three) times daily. Take first dose at night and monitor for drowsiness. (Patient not taking: Reported on 10/04/2019)  . [DISCONTINUED] dextroamphetamine 20 MG TABS Take 10 mg by mouth daily.  . [DISCONTINUED] doxycycline (VIBRA-TABS) 100 MG tablet Take 1 tablet (100 mg total) by mouth 2 (two) times daily. (Patient not taking: Reported on 10/04/2019)   No facility-administered encounter medications on file as of 10/04/2019.   Patient Active Problem List   Diagnosis Date Noted  . Depression, recurrent ([place]) 10/05/2019  . Upper back pain on right side 10/05/2019  . Controlled substance agreement signed 10/05/2019  . Neck pain 09/18/2019  . Attention deficit hyperactivity disorder (ADHD), predominantly inattentive type 09/18/2019   Past Medical History:  Diagnosis Date  . ADHD   . Anxiety   . Arthritis    hands - tx with otc meds prn  . Depression    Relevant past medical, surgical, family and social history reviewed and updated as indicated. Interim medical history since our last  visit reviewed.  Review of Systems  Constitutional: Negative.  Negative for activity change, appetite change, fatigue and fever.  Eyes: Negative.  Negative for visual disturbance.  Respiratory: Negative.  Negative for shortness of breath.   Cardiovascular: Negative.  Negative for chest pain.  Gastrointestinal: Positive for constipation. Negative for abdominal pain, blood in stool, diarrhea, nausea and vomiting.  Musculoskeletal: Positive for back pain and neck pain. Negative for arthralgias, gait problem, joint swelling, myalgias and neck stiffness.  Skin: Negative.  Negative for color change and rash.  Neurological: Negative.  Negative for dizziness, weakness, numbness and headaches.  Hematological: Negative.   Psychiatric/Behavioral: Positive for decreased concentration. Negative for behavioral problems, confusion, sleep disturbance and suicidal ideas. The patient is not nervous/anxious.    Per HPI unless specifically indicated above     Objective:    BP 110/61 (BP Location: Left Arm, Patient Position: Sitting, Cuff Size: Normal)   Pulse 75   Temp  98 F (36.7 C) (Oral)   Ht 5' 6.5" (1.689 m)   Wt 123 lb (55.8 kg)   LMP 10/25/2011   SpO2 96%   BMI 19.56 kg/m   Wt Readings from Last 3 Encounters:  10/04/19 123 lb (55.8 kg)  09/17/19 123 lb (55.8 kg)  06/22/11 125 lb (56.7 kg)    Physical Exam Vitals and nursing note reviewed.  Constitutional:      General: She is not in acute distress.    Appearance: Normal appearance. She is normal weight.  Eyes:     General: No scleral icterus.    Extraocular Movements: Extraocular movements intact.  Cardiovascular:     Rate and Rhythm: Normal rate and regular rhythm.     Heart sounds: Normal heart sounds. No murmur heard.   Pulmonary:     Effort: Pulmonary effort is normal. No respiratory distress.     Breath sounds: Normal breath sounds. No wheezing, rhonchi or rales.  Abdominal:     General: Abdomen is flat. Bowel sounds are  normal.     Palpations: Abdomen is soft.  Musculoskeletal:        General: Normal range of motion.     Cervical back: Normal range of motion. Tenderness present. No rigidity.     Thoracic back: Bony tenderness (above right scapula) present.     Right lower leg: No edema.     Left lower leg: No edema.  Lymphadenopathy:     Cervical: No cervical adenopathy.  Skin:    General: Skin is warm and dry.     Coloration: Skin is not jaundiced or pale.  Neurological:     General: No focal deficit present.     Mental Status: She is alert and oriented to person, place, and time.     Motor: No weakness.     Gait: Gait normal.  Psychiatric:        Mood and Affect: Mood normal.        Behavior: Behavior normal.        Thought Content: Thought content normal.        Judgment: Judgment normal.      Assessment & Plan:   Problem List Items Addressed This Visit      Other   Neck pain    Acute, ongoing.  With relief using muscle relaxer but still an issue.  Will obtain x-ray imaging today and place referral for Physical Therapy.  Follow up in 4 weeks.       Relevant Orders   Ambulatory referral to Physical Therapy   Attention deficit hyperactivity disorder (ADHD), predominantly inattentive type - Primary    Chronic, ongoing.  Will continue on daily Adderall XR 30 mg and daily dextromethorphan 20 mg.  Does not need a refill of Adderall today, has about 1 month supply left.  Dextromethrophan sent into pharmacy after PDMP reviewed and appropriate.  Controlled substance contract signed today.  Records to be requested from [facility] in [place].      Relevant Medications   dextroamphetamine 20 MG TABS   dextroamphetamine 20 MG TABS (Start on 11/03/2019)   Depression, recurrent ([facility])    Chronic, stable on sertraline 100 mg.  Advised to call or return to clinic with any issues.      Upper back pain on right side   Relevant Orders   Ambulatory referral to Physical Therapy   Controlled  substance agreement signed    Other Visit Diagnoses    Screening for colon cancer  GI referral generated for colonoscopy.    Relevant Orders   Ambulatory referral to Gastroenterology       Follow up plan: Return in about 4 weeks (around 11/01/2019) for annual physical.

## 2019-10-05 ENCOUNTER — Other Ambulatory Visit: Payer: Self-pay | Admitting: Nurse Practitioner

## 2019-10-05 ENCOUNTER — Encounter: Payer: Self-pay | Admitting: Nurse Practitioner

## 2019-10-05 ENCOUNTER — Telehealth: Payer: Self-pay | Admitting: Nurse Practitioner

## 2019-10-05 DIAGNOSIS — F339 Major depressive disorder, recurrent, unspecified: Secondary | ICD-10-CM | POA: Insufficient documentation

## 2019-10-05 DIAGNOSIS — Z79899 Other long term (current) drug therapy: Secondary | ICD-10-CM | POA: Insufficient documentation

## 2019-10-05 DIAGNOSIS — M549 Dorsalgia, unspecified: Secondary | ICD-10-CM | POA: Insufficient documentation

## 2019-10-05 DIAGNOSIS — F329 Major depressive disorder, single episode, unspecified: Secondary | ICD-10-CM | POA: Insufficient documentation

## 2019-10-05 DIAGNOSIS — F900 Attention-deficit hyperactivity disorder, predominantly inattentive type: Secondary | ICD-10-CM

## 2019-10-05 MED ORDER — DEXTROAMPHETAMINE SULFATE 10 MG PO TABS: 20.0000 mg | ORAL_TABLET | Freq: Every day | ORAL | 0 refills | Status: DC

## 2019-10-05 MED ORDER — DEXTROAMPHETAMINE SULFATE 20 MG PO TABS: 20.0000 mg | ORAL_TABLET | Freq: Every day | ORAL | 0 refills | Status: DC

## 2019-10-05 NOTE — Patient Instructions (Signed)
  X-Rays  X-rays are pictures of the inside of the body. An X-ray machine creates these pictures using waves of energy called radiation. Bones and tissues in the body absorb different amounts of radiation, which show up on the X-ray pictures in shades of black, gray, and white. X-rays are used to check for many health conditions, including broken bones, lung problems, and some types of cancer. Tell a health care provider about:  Any allergies you have.  All medicines you are taking, including vitamins, herbs, eye drops, creams, and over-the-counter medicines.  Any surgeries you have had.  Any medical conditions you have.  Whether you are pregnant or may be pregnant. What are the risks? Generally, this is a safe procedure. However, being exposed to too much radiation over a lifetime can increase the risk of cancer. The risk from a single X-ray test is small. What happens before the procedure?  You may need to remove glasses, jewelry, and any other metal objects.  You will likely be asked to undress whatever part of your body needs the X-ray. If needed, you will be given a hospital gown to wear.  You may be asked to wear a protective lead apron to shield parts of your body from the X-ray. What happens during the procedure?   You will be asked to stay as still as possible during the exam in order to get the best possible images.  The X-ray machine will create a picture by using a tiny burst of radiation. This is painless.  You may need to have several pictures taken at different angles. The procedure may vary among health care providers and hospitals. What happens after the procedure?  You will be able to return to your normal activities.  The X-ray images will be examined by your health care provider or an X-ray (radiology) specialist.  It is up to you to get your test results. Ask your health care provider, or the department that is doing the test, when your results will be  ready. Summary  X-rays are pictures of the inside of the body. An X-ray machine creates these pictures using waves of energy called radiation.  Generally, this is a safe procedure. However, being exposed to too much radiation over a lifetime can increase the risk of cancer. The risk from a single X-ray test is small.  You will be asked to stay as still as possible during the exam in order to get the best possible images.  It is up to you to get your test results. Ask your health care provider, or the department that is doing the test, when your results will be ready. This information is not intended to replace advice given to you by your health care provider. Make sure you discuss any questions you have with your health care provider. Document Revised: 05/10/2017 Document Reviewed: 02/10/2017 Elsevier Patient Education  2020 [employer].

## 2019-10-05 NOTE — Telephone Encounter (Signed)
Verbal from Carnella [place] to cancel all prescriptions at [facility], she will electronically send them to [facility].  Prescriptions canceled.

## 2019-10-05 NOTE — Progress Notes (Signed)
All dextroamphetamine 20 mg tablet prescriptions at [facility] pharmacy cancelled - patient was unable to pick up prescriptions and is switching to [facility].  Dextroamphetamine prescriptions send to [facility].

## 2019-10-05 NOTE — Assessment & Plan Note (Addendum)
Acute, ongoing.  With relief using muscle relaxer but still an issue.  Will obtain x-ray imaging today and place referral for Physical Therapy.  Follow up in 4 weeks.

## 2019-10-05 NOTE — Assessment & Plan Note (Addendum)
Chronic, ongoing.  Will continue on daily Adderall XR 30 mg and daily dextromethorphan 20 mg.  Does not need a refill of Adderall today, has about 1 month supply left.  Dextromethrophan sent into pharmacy after PDMP reviewed and appropriate.  Controlled substance contract signed today.  Records to be requested from [facility] in [place].

## 2019-10-05 NOTE — Telephone Encounter (Signed)
Rx resubmitted for 20 mg by mouth daily.

## 2019-10-05 NOTE — Telephone Encounter (Signed)
Pharmacy called stating that they need clarification on the pts dextroamphetamine prescription. Please advise.       [id number] - [place], [place] - [address]  [address] [place] [address]  Phone: [phone] Fax: [phone]  Hours: Not open 24 hours

## 2019-10-05 NOTE — Assessment & Plan Note (Signed)
Chronic, stable on sertraline 100 mg.  Advised to call or return to clinic with any issues.

## 2019-10-08 ENCOUNTER — Telehealth: Payer: Self-pay | Admitting: Nurse Practitioner

## 2019-10-08 NOTE — Telephone Encounter (Signed)
Called home number and left detailed message per DPR - spine and neck imaging do not show acute abnormalities.  See result note.

## 2019-11-05 ENCOUNTER — Other Ambulatory Visit: Payer: Self-pay

## 2019-11-05 ENCOUNTER — Encounter: Payer: Self-pay | Admitting: Nurse Practitioner

## 2019-11-05 ENCOUNTER — Ambulatory Visit (INDEPENDENT_AMBULATORY_CARE_PROVIDER_SITE_OTHER): Payer: 59 | Admitting: Nurse Practitioner

## 2019-11-05 ENCOUNTER — Telehealth: Payer: Self-pay

## 2019-11-05 VITALS — BP 92/56 | HR 75 | Temp 97.9°F | Ht 67.0 in | Wt 120.4 lb

## 2019-11-05 DIAGNOSIS — F339 Major depressive disorder, recurrent, unspecified: Secondary | ICD-10-CM | POA: Diagnosis not present

## 2019-11-05 DIAGNOSIS — Z13220 Encounter for screening for lipoid disorders: Secondary | ICD-10-CM

## 2019-11-05 DIAGNOSIS — Z0000 Encounter for general adult medical examination without abnormal findings: Secondary | ICD-10-CM | POA: Diagnosis not present

## 2019-11-05 DIAGNOSIS — Z1159 Encounter for screening for other viral diseases: Secondary | ICD-10-CM

## 2019-11-05 DIAGNOSIS — Z79899 Other long term (current) drug therapy: Secondary | ICD-10-CM

## 2019-11-05 DIAGNOSIS — Z1329 Encounter for screening for other suspected endocrine disorder: Secondary | ICD-10-CM | POA: Diagnosis not present

## 2019-11-05 DIAGNOSIS — F900 Attention-deficit hyperactivity disorder, predominantly inattentive type: Secondary | ICD-10-CM | POA: Diagnosis not present

## 2019-11-05 LAB — UA/M W/RFLX CULTURE, ROUTINE
Bilirubin, UA: NEGATIVE
Glucose, UA: NEGATIVE
Ketones, UA: NEGATIVE
Leukocytes,UA: NEGATIVE
Nitrite, UA: NEGATIVE
Protein,UA: NEGATIVE
RBC, UA: NEGATIVE
Specific Gravity, UA: 1.025 (ref 1.005–1.030)
Urobilinogen, Ur: 0.2 mg/dL (ref 0.2–1.0)
pH, UA: 5.5 (ref 5.0–7.5)

## 2019-11-05 MED ORDER — SERTRALINE HCL 50 MG PO TABS: 50.0000 mg | ORAL_TABLET | Freq: Every day | ORAL | 0 refills | Status: DC

## 2019-11-05 MED ORDER — AMPHETAMINE-DEXTROAMPHET ER 30 MG PO CP24: 30.0000 mg | ORAL_CAPSULE | Freq: Two times a day (BID) | ORAL | 0 refills | Status: DC

## 2019-11-05 MED ORDER — SERTRALINE HCL 100 MG PO TABS
100.0000 mg | ORAL_TABLET | Freq: Every day | ORAL | 0 refills | Status: DC
Start: 2019-11-05 — End: 2020-01-07

## 2019-11-05 NOTE — Progress Notes (Signed)
BP (!) 92/56    Pulse 75    Temp 97.9 F (36.6 C) (Oral)    Ht 5\' 7"  (1.702 m)    Wt 120 lb 6.4 oz (54.6 kg)    LMP 10/25/2011    SpO2 99%    BMI 18.86 kg/m    Subjective:    Patient ID: Chelsea Velez, female    DOB: 12-Apr-1966, 54 y.o.   MRN: [id number]  HPI: Chelsea Velez is a 54 y.o. female presenting on 11/05/2019 for comprehensive medical examination. Current medical complaints include:  ADHD Patient reports she ran out of Adderall over weekend and poorer control of ADHD since.  Doing okay overall. ADHD status: exacerbated Satisfied with current therapy: yes Medication compliance:  excellent compliance Controlled substance contract: yes Previous psychiatry evaluation: yes Previous medications: Adderall 30 mg XR and dextroamphetamine 20 mg   Taking meds on weekends/vacations: occasionally Work/school performance:  good Difficulty sustaining attention/completing tasks: yes Distracted by extraneous stimuli: yes Does not listen when spoken to: no  Fidgets with hands or feet: yes Unable to stay in seat: no Blurts out/interrupts others: no ADHD Medication Side Effects: no    Decreased appetite: no    Headache: no    Sleeping disturbance pattern: no    Irritability: yes    Rebound effects (worse than baseline) off medication: no    Anxiousness: no    Dizziness: no    Tics: no   DEPRESSION Patient has been stable on sertraline 100 mg for years.  She does not think it is helping as much as it was in the past.  Reports feeling "devastated" that her son is moving to [place]. Mood status: exacerbated Satisfied with current treatment?: no Symptom severity: moderate  Duration of current treatment : years Side effects: no Medication compliance: excellent compliance Psychotherapy/counseling: no Previous psychiatric medications: sertraline 100 mg Depressed mood: yes Anxious mood: yes Anhedonia: yes Significant weight loss or gain: no Insomnia: no  Fatigue:  yes Feelings of worthlessness or guilt: no Impaired concentration/indecisiveness: yes Suicidal ideations: no Hopelessness: no Crying spells: no Depression screen [facility] 2/9 11/05/2019 09/17/2019  Decreased Interest 2 0  Down, Depressed, Hopeless 1 0  PHQ - 2 Score 3 0  Altered sleeping 2 2  Tired, decreased energy 2 1  Change in appetite 1 1  Feeling bad or failure about yourself  0 0  Trouble concentrating 1 0  Moving slowly or fidgety/restless 0 0  Suicidal thoughts 0 0  PHQ-9 Score 9 4  Difficult doing work/chores Very difficult Somewhat difficult   She currently lives with: Self Menopausal Symptoms: no  The patient does not have a history of falls. I did not complete a risk assessment for falls. A plan of care for falls was not documented.   Past Medical History:  Past Medical History:  Diagnosis Date   ADHD    Anxiety    Arthritis    hands - tx with otc meds prn   Depression     Surgical History:  Past Surgical History:  Procedure Laterality Date   BLADDER SUSPENSION  08/04/2011   Procedure: TRANSVAGINAL TAPE (TVT) PROCEDURE;  Surgeon: Daria Pastures, MD;  Location: [facility];  Service: Gynecology;  Laterality: N/A;   BREAST SURGERY     Right breast lumpectomy   CESAREAN SECTION  1995   CYSTOSCOPY  08/04/2011   Procedure: CYSTOSCOPY;  Surgeon: Daria Pastures, MD;  Location: [facility];  Service: Gynecology;  Laterality: N/A;  endosocpy     EYE SURGERY     [facility] Teeth Ext      Medications:  Current Outpatient Medications on File Prior to Visit  Medication Sig   [START ON 12/04/2019] dextroamphetamine (DEXTROSTAT) 10 MG tablet Take 2 tablets (20 mg total) by mouth daily.   dextroamphetamine (DEXTROSTAT) 10 MG tablet Take 2 tablets (20 mg total) by mouth daily.   dextroamphetamine (DEXTROSTAT) 10 MG tablet Take 2 tablets (20 mg total) by mouth daily.   valACYclovir (VALTREX) 500 MG tablet Take by mouth. As needed   tizanidine (ZANAFLEX) 2 MG  capsule Take 1 capsule (2 mg total) by mouth 3 (three) times daily. Take first dose at night and monitor for drowsiness. (Patient not taking: Reported on 10/04/2019)   No current facility-administered medications on file prior to visit.    Allergies:  No Known Allergies  Social History:  Social History   Socioeconomic History   Marital status: Single    Spouse name: Not on file   Number of children: Not on file   Years of education: Not on file   Highest education level: Not on file  Occupational History   Not on file  Tobacco Use   Smoking status: Former Smoker    Packs/day: 0.25    Years: 8.00    Pack years: 2.00    Types: Cigarettes    Quit date: 09/10/1992    Years since quitting: 27.1   Smokeless tobacco: Never Used  Vaping Use   Vaping Use: Never used  Substance and Sexual Activity   Alcohol use: Yes    Comment: socially   Drug use: Yes    Types: Marijuana   Sexual activity: Not Currently    Birth control/protection: None  Other Topics Concern   Not on file  Social History Narrative   Not on file   Social Determinants of Health   Financial Resource Strain:    Difficulty of Paying Living Expenses:   Food Insecurity:    Worried About Charity fundraiser in the Last Year:    Arboriculturist in the Last Year:   Transportation Needs:    Film/video editor (Medical):    Lack of Transportation (Non-Medical):   Physical Activity:    Days of Exercise per Week:    Minutes of Exercise per Session:   Stress:    Feeling of Stress :   Social Connections:    Frequency of Communication with Friends and Family:    Frequency of Social Gatherings with Friends and Family:    Attends Religious Services:    Active Member of Clubs or Organizations:    Attends Music therapist:    Marital Status:   Intimate Partner Violence:    Fear of Current or Ex-Partner:    Emotionally Abused:    Physically Abused:    Sexually Abused:     Social History   Tobacco Use  Smoking Status Former Smoker   Packs/day: 0.25   Years: 8.00   Pack years: 2.00   Types: Cigarettes   Quit date: 09/10/1992   Years since quitting: 27.1  Smokeless Tobacco Never Used   Social History   Substance and Sexual Activity  Alcohol Use Yes   Comment: socially    Family History:  Family History  Problem Relation Age of Onset   Cervical cancer Mother    Breast cancer Mother    Diabetes Mother    Hypertension Mother    Clotting disorder Mother  Past medical history, surgical history, medications, allergies, family history and social history reviewed with patient today and changes made to appropriate areas of the chart.   Review of Systems  Constitutional: Negative.  Negative for chills, fever and malaise/fatigue.  HENT: Negative.  Negative for congestion, ear pain, hearing loss, sinus pain and sore throat.   Eyes: Negative.  Negative for blurred vision.  Respiratory: Negative.  Negative for cough, shortness of breath and wheezing.   Cardiovascular: Negative.  Negative for chest pain and palpitations.  Gastrointestinal: Negative.   Genitourinary: Negative.   Musculoskeletal: Negative.   Skin: Negative.  Negative for itching and rash.  Neurological: Negative.   Psychiatric/Behavioral: Positive for depression. The patient is not nervous/anxious and does not have insomnia.     All other ROS negative except what is listed above and in the HPI.      Objective:    BP (!) 92/56    Pulse 75    Temp 97.9 F (36.6 C) (Oral)    Ht 5\' 7"  (1.702 m)    Wt 120 lb 6.4 oz (54.6 kg)    LMP 10/25/2011    SpO2 99%    BMI 18.86 kg/m   Wt Readings from Last 3 Encounters:  11/05/19 120 lb 6.4 oz (54.6 kg)  10/04/19 123 lb (55.8 kg)  09/17/19 123 lb (55.8 kg)    Physical Exam Vitals and nursing note reviewed.  Constitutional:      General: She is not in acute distress.    Appearance: Normal appearance. She is normal weight. She  is not ill-appearing or toxic-appearing.  HENT:     Head: Normocephalic and atraumatic.     Right Ear: Tympanic membrane, ear canal and external ear normal.     Left Ear: Tympanic membrane, ear canal and external ear normal.     Nose: Nose normal. No congestion or rhinorrhea.     Mouth/Throat:     Mouth: Mucous membranes are moist.     Pharynx: Oropharynx is clear. No oropharyngeal exudate.  Eyes:     General: No scleral icterus.    Extraocular Movements: Extraocular movements intact.     Pupils: Pupils are equal, round, and reactive to light.  Cardiovascular:     Rate and Rhythm: Normal rate and regular rhythm.     Pulses: Normal pulses.     Heart sounds: Normal heart sounds. No murmur heard.   Pulmonary:     Effort: Pulmonary effort is normal. No respiratory distress.     Breath sounds: No wheezing or rhonchi.  Chest:     Comments: Deferred - breast exam performed by GYN earlier this year Abdominal:     General: Abdomen is flat. Bowel sounds are normal. There is no distension.     Palpations: Abdomen is soft.     Tenderness: There is no abdominal tenderness.  Genitourinary:    Comments: Deferred - pap smear with GYN earlier this year Musculoskeletal:        General: No swelling or tenderness. Normal range of motion.     Cervical back: Normal range of motion and neck supple. No rigidity or tenderness.     Right lower leg: No edema.     Left lower leg: No edema.  Skin:    General: Skin is warm and dry.     Capillary Refill: Capillary refill takes less than 2 seconds.     Coloration: Skin is not jaundiced or pale.  Neurological:     General: No focal  deficit present.     Mental Status: She is alert and oriented to person, place, and time.     Motor: No weakness.     Gait: Gait normal.  Psychiatric:        Mood and Affect: Mood normal.        Behavior: Behavior normal.        Thought Content: Thought content normal.        Judgment: Judgment normal.    Results for  orders placed or performed in visit on 11/05/19  UA/M w/rflx Culture, Routine   Specimen: Urine   Urine  Result Value Ref Range   Specific Gravity, UA 1.025 1.005 - 1.030   pH, UA 5.5 5.0 - 7.5   Color, UA Yellow Yellow   Appearance Ur Clear Clear   Leukocytes,UA Negative Negative   Protein,UA Negative Negative/Trace   Glucose, UA Negative Negative   Ketones, UA Negative Negative   RBC, UA Negative Negative   Bilirubin, UA Negative Negative   Urobilinogen, Ur 0.2 0.2 - 1.0 mg/dL   Nitrite, UA Negative Negative      Assessment & Plan:   Problem List Items Addressed This Visit      Other   Attention deficit hyperactivity disorder (ADHD), predominantly inattentive type    Chronic, ongoing.  Continue Adderall XR 30 mg twice daily and daily dextromethorphan 20 mg.  PDMP reviewed and appropriate - Adderall sent to pharmacy today.  UDS today.  Awaiting records from [facility] in [place].      Relevant Medications   amphetamine-dextroamphetamine (ADDERALL XR) 30 MG 24 hr capsule   amphetamine-dextroamphetamine (ADDERALL XR) 30 MG 24 hr capsule (Start on 12/05/2019)   Depression, recurrent (HCC) - Primary    Chronic, ongoing.  PHQ-9 elevated today and with recent increase in stressors.  Discussed options and patient in agreement with increasing sertraline to 150 mg daily.  Will follow up in 2 months.      Relevant Medications   sertraline (ZOLOFT) 100 MG tablet   sertraline (ZOLOFT) 50 MG tablet   Other Relevant Orders   CBC with Differential/Platelet   Comprehensive metabolic panel    Other Visit Diagnoses    Need for hepatitis C screening test       Relevant Orders   Hepatitis C antibody   Annual physical exam       Relevant Orders   CBC with Differential/Platelet   Comprehensive metabolic panel   UA/M w/rflx Culture, Routine (Completed)   Screening for thyroid disorder       Relevant Orders   TSH   Screening for cholesterol level       Relevant Orders    Lipid Panel w/o Chol/HDL Ratio   Long-term current use of stimulant       Relevant Orders   Urine drugs of abuse scrn w alc, routine (Ref Lab)       Follow up plan: Return in about 2 months (around 01/06/2020) for adhd f/u.   LABORATORY TESTING:  - Pap smear: up to date - records requested  IMMUNIZATIONS:   - Tdap: Tetanus vaccination status reviewed:, tetanus status unknown to the patient. - Influenza: Postponed to flu season - Pneumovax: Not applicable - Prevnar: Not applicable - HPV: Not applicable - Zostavax vaccine: Up to date  SCREENING: -Mammogram: Up to date  - Colonoscopy: ordered at last visit - pending  - Bone Density: Not applicable  -Hearing Test: Not applicable  -Spirometry: Not applicable   PATIENT COUNSELING:  Advised to take 1 mg of folate supplement per day if capable of pregnancy.   Sexuality: Discussed sexually transmitted diseases, partner selection, use of condoms, avoidance of unintended pregnancy  and contraceptive alternatives.   Advised to avoid cigarette smoking.  I discussed with the patient that most people either abstain from alcohol or drink within safe limits (<=14/week and <=4 drinks/occasion for males, <=7/weeks and <= 3 drinks/occasion for females) and that the risk for alcohol disorders and other health effects rises proportionally with the number of drinks per week and how often a drinker exceeds daily limits.  Discussed cessation/primary prevention of drug use and availability of treatment for abuse.   Diet: Encouraged to adjust caloric intake to maintain  or achieve ideal body weight, to reduce intake of dietary saturated fat and total fat, to limit sodium intake by avoiding high sodium foods and not adding table salt, and to maintain adequate dietary potassium and calcium preferably from fresh fruits, vegetables, and low-fat dairy products.    stressed the importance of regular exercise  Injury prevention: Discussed safety belts,  safety helmets, smoke detector, smoking near bedding or upholstery.   Dental health: Discussed importance of regular tooth brushing, flossing, and dental visits.    NEXT PREVENTATIVE PHYSICAL DUE IN 1 YEAR. Return in about 2 months (around 01/06/2020) for adhd f/u.

## 2019-11-05 NOTE — Telephone Encounter (Signed)
Prior Authorization initiated via CoverMyMeds for Adderall Key: [id number]

## 2019-11-05 NOTE — Assessment & Plan Note (Signed)
Chronic, ongoing.  Continue Adderall XR 30 mg twice daily and daily dextromethorphan 20 mg.  PDMP reviewed and appropriate - Adderall sent to pharmacy today.  UDS today.  Awaiting records from [facility] in [place].

## 2019-11-05 NOTE — Telephone Encounter (Signed)
PA Approved

## 2019-11-05 NOTE — Assessment & Plan Note (Signed)
Chronic, ongoing.  PHQ-9 elevated today and with recent increase in stressors.  Discussed options and patient in agreement with increasing sertraline to 150 mg daily.  Will follow up in 2 months.

## 2019-11-05 NOTE — Patient Instructions (Addendum)
Nice seeing you today, Chelsea Velez!  Your prescriptions have been sent to [facility]. Remember to increase the dose of the sertraline to 150 mg - so take one 100 mg pill and one 50 mg pill. I have sent your Adderall to [facility], also.  Feel free to pick up cetirizine or loratadine over-the-counter for your allergy symptoms.  We will let you know the results from your labs later this week.   See if you in about 2 months.  Please do not hesitate to reach out if anything comes up in the meantime!  Preventive Care 16-53 Years Old, Female Preventive care refers to visits with your health care provider and lifestyle choices that can promote health and wellness. This includes:  A yearly physical exam. This may also be called an annual well check.  Regular dental visits and eye exams.  Immunizations.  Screening for certain conditions.  Healthy lifestyle choices, such as eating a healthy diet, getting regular exercise, not using drugs or products that contain nicotine and tobacco, and limiting alcohol use. What can I expect for my preventive care visit? Physical exam Your health care provider will check your:  Height and weight. This may be used to calculate body mass index (BMI), which tells if you are at a healthy weight.  Heart rate and blood pressure.  Skin for abnormal spots. Counseling Your health care provider may ask you questions about your:  Alcohol, tobacco, and drug use.  Emotional well-being.  Home and relationship well-being.  Sexual activity.  Eating habits.  Work and work Statistician.  Method of birth control.  Menstrual cycle.  Pregnancy history. What immunizations do I need?  Influenza (flu) vaccine  This is recommended every year. Tetanus, diphtheria, and pertussis (Tdap) vaccine  You may need a Td booster every 10 years. Varicella (chickenpox) vaccine  You may need this if you have not been vaccinated. Zoster (shingles) vaccine  You  may need this after age 28. Measles, mumps, and rubella (MMR) vaccine  You may need at least one dose of MMR if you were born in 1957 or later. You may also need a second dose. Pneumococcal conjugate (PCV13) vaccine  You may need this if you have certain conditions and were not previously vaccinated. Pneumococcal polysaccharide (PPSV23) vaccine  You may need one or two doses if you smoke cigarettes or if you have certain conditions. Meningococcal conjugate (MenACWY) vaccine  You may need this if you have certain conditions. Hepatitis A vaccine  You may need this if you have certain conditions or if you travel or work in places where you may be exposed to hepatitis A. Hepatitis B vaccine  You may need this if you have certain conditions or if you travel or work in places where you may be exposed to hepatitis B. Haemophilus influenzae type b (Hib) vaccine  You may need this if you have certain conditions. Human papillomavirus (HPV) vaccine  If recommended by your health care provider, you may need three doses over 6 months. You may receive vaccines as individual doses or as more than one vaccine together in one shot (combination vaccines). Talk with your health care provider about the risks and benefits of combination vaccines. What tests do I need? Blood tests  Lipid and cholesterol levels. These may be checked every 5 years, or more frequently if you are over 72 years old.  Hepatitis C test.  Hepatitis B test. Screening  Lung cancer screening. You may have this screening every year  starting at age 28 if you have a 30-pack-year history of smoking and currently smoke or have quit within the past 15 years.  Colorectal cancer screening. All adults should have this screening starting at age 34 and continuing until age 40. Your health care provider may recommend screening at age 39 if you are at increased risk. You will have tests every 1-10 years, depending on your results and the  type of screening test.  Diabetes screening. This is done by checking your blood sugar (glucose) after you have not eaten for a while (fasting). You may have this done every 1-3 years.  Mammogram. This may be done every 1-2 years. Talk with your health care provider about when you should start having regular mammograms. This may depend on whether you have a family history of breast cancer.  BRCA-related cancer screening. This may be done if you have a family history of breast, ovarian, tubal, or peritoneal cancers.  Pelvic exam and Pap test. This may be done every 3 years starting at age 26. Starting at age 51, this may be done every 5 years if you have a Pap test in combination with an HPV test. Other tests  Sexually transmitted disease (STD) testing.  Bone density scan. This is done to screen for osteoporosis. You may have this scan if you are at high risk for osteoporosis. Follow these instructions at home: Eating and drinking  Eat a diet that includes fresh fruits and vegetables, whole grains, lean protein, and low-fat dairy.  Take vitamin and mineral supplements as recommended by your health care provider.  Do not drink alcohol if: ? Your health care provider tells you not to drink. ? You are pregnant, may be pregnant, or are planning to become pregnant.  If you drink alcohol: ? Limit how much you have to 0-1 drink a day. ? Be aware of how much alcohol is in your drink. In the [place] one drink equals one 12 oz bottle of beer (355 mL), one 5 oz glass of wine (148 mL), or one 1 oz glass of hard liquor (44 mL). Lifestyle  Take daily care of your teeth and gums.  Stay active. Exercise for at least 30 minutes on 5 or more days each week.  Do not use any products that contain nicotine or tobacco, such as cigarettes, e-cigarettes, and chewing tobacco. If you need help quitting, ask your health care provider.  If you are sexually active, practice safe sex. Use a condom or other form  of birth control (contraception) in order to prevent pregnancy and STIs (sexually transmitted infections).  If told by your health care provider, take low-dose aspirin daily starting at age 43. What's next?  Visit your health care provider once a year for a well check visit.  Ask your health care provider how often you should have your eyes and teeth checked.  Stay up to date on all vaccines. This information is not intended to replace advice given to you by your health care provider. Make sure you discuss any questions you have with your health care provider. Document Revised: 12/08/2017 Document Reviewed: 12/08/2017 Elsevier Patient Education  2020 [employer].

## 2019-11-06 LAB — CBC WITH DIFFERENTIAL/PLATELET
Basophils Absolute: 0 10*3/uL (ref 0.0–0.2)
Basos: 1 %
EOS (ABSOLUTE): 0.1 10*3/uL (ref 0.0–0.4)
Eos: 2 %
Hematocrit: 39.7 % (ref 34.0–46.6)
Hemoglobin: 13 g/dL (ref 11.1–15.9)
Immature Grans (Abs): 0 10*3/uL (ref 0.0–0.1)
Immature Granulocytes: 0 %
Lymphocytes Absolute: 1.4 10*3/uL (ref 0.7–3.1)
Lymphs: 30 %
MCH: 32.5 pg (ref 26.6–33.0)
MCHC: 32.7 g/dL (ref 31.5–35.7)
MCV: 99 fL — ABNORMAL HIGH (ref 79–97)
Monocytes Absolute: 0.6 10*3/uL (ref 0.1–0.9)
Monocytes: 12 %
Neutrophils Absolute: 2.5 10*3/uL (ref 1.4–7.0)
Neutrophils: 55 %
Platelets: 246 10*3/uL (ref 150–450)
RBC: 4 x10E6/uL (ref 3.77–5.28)
RDW: 12.8 % (ref 11.7–15.4)
WBC: 4.6 10*3/uL (ref 3.4–10.8)

## 2019-11-06 LAB — COMPREHENSIVE METABOLIC PANEL
ALT: 29 IU/L (ref 0–32)
AST: 24 IU/L (ref 0–40)
Albumin/Globulin Ratio: 2.3 — ABNORMAL HIGH (ref 1.2–2.2)
Albumin: 4.6 g/dL (ref 3.8–4.9)
Alkaline Phosphatase: 56 IU/L (ref 48–121)
BUN/Creatinine Ratio: 21 (ref 9–23)
BUN: 16 mg/dL (ref 6–24)
Bilirubin Total: 0.6 mg/dL (ref 0.0–1.2)
CO2: 26 mmol/L (ref 20–29)
Calcium: 9.4 mg/dL (ref 8.7–10.2)
Chloride: 102 mmol/L (ref 96–106)
Creatinine, Ser: 0.76 mg/dL (ref 0.57–1.00)
GFR calc Af Amer: 104 mL/min/{1.73_m2} (ref >59)
GFR calc non Af Amer: 90 mL/min/{1.73_m2} (ref >59)
Globulin, Total: 2 g/dL (ref 1.5–4.5)
Glucose: 88 mg/dL (ref 65–99)
Potassium: 5.1 mmol/L (ref 3.5–5.2)
Sodium: 140 mmol/L (ref 134–144)
Total Protein: 6.6 g/dL (ref 6.0–8.5)

## 2019-11-06 LAB — LIPID PANEL W/O CHOL/HDL RATIO
Cholesterol, Total: 195 mg/dL (ref 100–199)
HDL: 58 mg/dL (ref >39)
LDL Chol Calc (NIH): 123 mg/dL — ABNORMAL HIGH (ref 0–99)
Triglycerides: 77 mg/dL (ref 0–149)
VLDL Cholesterol Cal: 14 mg/dL (ref 5–40)

## 2019-11-06 LAB — TSH: TSH: 0.993 u[IU]/mL (ref 0.450–4.500)

## 2019-11-06 LAB — HEPATITIS C ANTIBODY: Hep C Virus Ab: <0.1 s/co ratio (ref 0.0–0.9)

## 2019-11-14 LAB — PANEL 799049
CARBOXY THC GC/MS CONF: >300 ng/mL
Cannabinoid GC/MS, Ur: POSITIVE — AB

## 2019-11-14 LAB — AMPHETAMINE CONF, UR
Amphetamine GC/MS Conf: >3000 ng/mL
Amphetamine: POSITIVE — AB
Amphetamines: POSITIVE — AB
Methamphetamine: NEGATIVE

## 2019-11-14 LAB — URINE DRUGS OF ABUSE SCREEN W ALC, ROUTINE (REF LAB)
Barbiturate Quant, Ur: NEGATIVE ng/mL
Benzodiazepine Quant, Ur: NEGATIVE ng/mL
Cocaine (Metab.): NEGATIVE ng/mL
Ethanol, Urine: NEGATIVE %
Methadone Screen, Urine: NEGATIVE ng/mL
Opiate Quant, Ur: NEGATIVE ng/mL
PCP Quant, Ur: NEGATIVE ng/mL
Propoxyphene: NEGATIVE ng/mL

## 2019-11-26 ENCOUNTER — Encounter: Payer: Self-pay | Admitting: Nurse Practitioner

## 2019-11-26 NOTE — Progress Notes (Signed)
BP 123/73 (BP Location: Left Arm, Patient Position: Sitting, Cuff Size: Normal)   Pulse 72   Temp 97.8 F (36.6 C) (Oral)   Ht 5\' 7"  (1.702 m)   Wt 121 lb (54.9 kg)   LMP 10/25/2011   SpO2 100%   BMI 18.95 kg/m    Subjective:    Patient ID: Chelsea Velez, female    DOB: [birth year], 54 y.o.   MRN: [id number]  HPI: Chelsea Velez is a 54 y.o. female presenting with rash.  Chief Complaint  Patient presents with  . Rash    Painful rash on right shoulder, upper back and arm. Patient started taking her Valtrex yesterday as she believes it's shingles.   [employer]ING[employer] reports she has never hand shingles in the past - feels like she doesn't have skin - feels like electric pain.  Was taking valtrex and stopped it a couple of weeks ago Duration: 3 Location: upper back, arm,  Painful:  yes Severity: severe  Paresthesia:  no Hyperesthesia: yes Itching:  yes Burning:  no Oozing:  no Blisters:  yes Fevers:  no - chills History of the same:  no Alleviating factors: calamine lotion helps with itchiness Status:  worse Treatments attempted: started Valtrex yesterday  No Known Allergies  Outpatient Encounter Medications as of 11/27/2019  Medication Sig  . amphetamine-dextroamphetamine (ADDERALL XR) 30 MG 24 hr capsule Take 1 capsule (30 mg total) by mouth 2 (two) times daily.  Derrill Memo ON 12/05/2019] amphetamine-dextroamphetamine (ADDERALL XR) 30 MG 24 hr capsule Take 1 capsule (30 mg total) by mouth 2 (two) times daily.  Derrill Memo ON 12/04/2019] dextroamphetamine (DEXTROSTAT) 10 MG tablet Take 2 tablets (20 mg total) by mouth daily.  Marland Kitchen dextroamphetamine (DEXTROSTAT) 10 MG tablet Take 2 tablets (20 mg total) by mouth daily.  Marland Kitchen dextroamphetamine (DEXTROSTAT) 10 MG tablet Take 2 tablets (20 mg total) by mouth daily.  . sertraline (ZOLOFT) 100 MG tablet Take 1 tablet (100 mg total) by mouth daily.  . sertraline (ZOLOFT) 50 MG tablet Take 1 tablet (50 mg total) by mouth daily.  .  valACYclovir (VALTREX) 500 MG tablet Take by mouth. As needed  . [DISCONTINUED] tizanidine (ZANAFLEX) 2 MG capsule Take 1 capsule (2 mg total) by mouth 3 (three) times daily. Take first dose at night and monitor for drowsiness. (Patient not taking: Reported on 11/27/2019)   No facility-administered encounter medications on file as of 11/27/2019.   Patient Active Problem List   Diagnosis Date Noted  . Herpes zoster without complication 88/50/2774  . Depression, recurrent ([place]) 10/05/2019  . Upper back pain on right side 10/05/2019  . Controlled substance agreement signed 10/05/2019  . Neck pain 09/18/2019  . Attention deficit hyperactivity disorder (ADHD), predominantly inattentive type 09/18/2019   Past Medical History:  Diagnosis Date  . ADHD   . Anxiety   . Arthritis    hands - tx with otc meds prn  . Depression    Relevant past medical, surgical, family and social history reviewed and updated as indicated. Interim medical history since our last visit reviewed.  Review of Systems  Constitutional: Positive for chills and fatigue. Negative for activity change, appetite change and fever.  Eyes: Negative.  Negative for visual disturbance.  Gastrointestinal: Negative.   Musculoskeletal: Positive for back pain and myalgias. Negative for arthralgias, gait problem, joint swelling, neck pain and neck stiffness.  Skin: Positive for rash. Negative for color change, pallor and wound.  Neurological: Negative.  Negative for dizziness,  light-headedness and headaches.  Psychiatric/Behavioral: Negative.  Negative for confusion and sleep disturbance. The patient is not nervous/anxious.    Per HPI unless specifically indicated above     Objective:    BP 123/73 (BP Location: Left Arm, Patient Position: Sitting, Cuff Size: Normal)   Pulse 72   Temp 97.8 F (36.6 C) (Oral)   Ht 5\' 7"  (1.702 m)   Wt 121 lb (54.9 kg)   LMP 10/25/2011   SpO2 100%   BMI 18.95 kg/m   Wt Readings from Last 3  Encounters:  11/27/19 121 lb (54.9 kg)  11/05/19 120 lb 6.4 oz (54.6 kg)  10/04/19 123 lb (55.8 kg)    Physical Exam Vitals and nursing note reviewed.  Constitutional:      General: She is not in acute distress.    Appearance: Normal appearance. She is not toxic-appearing.  HENT:     Head: Normocephalic and atraumatic.     Right Ear: External ear normal.     Left Ear: External ear normal.  Eyes:     General: No scleral icterus.    Extraocular Movements: Extraocular movements intact.  Musculoskeletal:     Cervical back: Normal range of motion.  Lymphadenopathy:     Cervical: No cervical adenopathy.  Skin:    General: Skin is warm and dry.     Coloration: Skin is not jaundiced or pale.     Findings: Rash (shingles cluster to left upper back, under left arm) present.  Neurological:     General: No focal deficit present.     Mental Status: She is alert and oriented to person, place, and time.     Motor: No weakness.     Gait: Gait normal.  Psychiatric:        Mood and Affect: Mood normal.        Behavior: Behavior normal.        Thought Content: Thought content normal.        Judgment: Judgment normal.       Assessment & Plan:   Problem List Items Addressed This Visit      Other   Herpes zoster without complication - Primary    Acute, ongoing.  Started 500 mg bid valcyclovir yesterday - to continue for 7 days, at least until rash improving.  Discussed use of gabapentin for neuropathy pain, declines for now but will be available to discuss with patient if she changes her mind.          Follow up plan: Return if symptoms worsen or fail to improve.

## 2019-11-27 ENCOUNTER — Ambulatory Visit: Payer: 59 | Admitting: Nurse Practitioner

## 2019-11-27 ENCOUNTER — Encounter: Payer: Self-pay | Admitting: Nurse Practitioner

## 2019-11-27 ENCOUNTER — Ambulatory Visit (INDEPENDENT_AMBULATORY_CARE_PROVIDER_SITE_OTHER): Payer: 59 | Admitting: Nurse Practitioner

## 2019-11-27 ENCOUNTER — Other Ambulatory Visit: Payer: Self-pay

## 2019-11-27 VITALS — BP 123/73 | HR 72 | Temp 97.8°F | Ht 67.0 in | Wt 121.0 lb

## 2019-11-27 DIAGNOSIS — B029 Zoster without complications: Secondary | ICD-10-CM | POA: Diagnosis not present

## 2019-11-27 NOTE — Patient Instructions (Signed)
Shingles  Shingles, which is also known as herpes zoster, is an infection that causes a painful skin rash and fluid-filled blisters. It is caused by a virus. Shingles only develops in people who:  Have had chickenpox.  Have been given a medicine to protect against chickenpox (have been vaccinated). Shingles is rare in this group. What are the causes? Shingles is caused by varicella-zoster virus (VZV). This is the same virus that causes chickenpox. After a person is exposed to VZV, the virus stays in the body in an inactive (dormant) state. Shingles develops if the virus is reactivated. This can happen many years after the first (initial) exposure to VZV. It is not known what causes this virus to be reactivated. What increases the risk? People who have had chickenpox or received the chickenpox vaccine are at risk for shingles. Shingles infection is more common in people who:  Are older than age 60.  Have a weakened disease-fighting system (immune system), such as people with: ? HIV. ? AIDS. ? Cancer.  Are taking medicines that weaken the immune system, such as transplant medicines.  Are experiencing a lot of stress. What are the signs or symptoms? Early symptoms of this condition include itching, tingling, and pain in an area on your skin. Pain may be described as burning, stabbing, or throbbing. A few days or weeks after early symptoms start, a painful red rash appears. The rash is usually on one side of the body and has a band-like or belt-like pattern. The rash eventually turns into fluid-filled blisters that break open, change into scabs, and dry up in about 2-3 weeks. At any time during the infection, you may also develop:  A fever.  Chills.  A headache.  An upset stomach. How is this diagnosed? This condition is diagnosed with a skin exam. Skin or fluid samples may be taken from the blisters before a diagnosis is made. These samples are examined under a microscope or sent to  a lab for testing. How is this treated? The rash may last for several weeks. There is not a specific cure for this condition. Your health care provider will probably prescribe medicines to help you manage pain, recover more quickly, and avoid long-term problems. Medicines may include:  Antiviral drugs.  Anti-inflammatory drugs.  Pain medicines.  Anti-itching medicines (antihistamines). If the area involved is on your face, you may be referred to a specialist, such as an eye doctor (ophthalmologist) or an ear, nose, and throat (ENT) doctor (otolaryngologist) to help you avoid eye problems, chronic pain, or disability. Follow these instructions at home: Medicines  Take over-the-counter and prescription medicines only as told by your health care provider.  Apply an anti-itch cream or numbing cream to the affected area as told by your health care provider. Relieving itching and discomfort   Apply cold, wet cloths (cold compresses) to the area of the rash or blisters as told by your health care provider.  Cool baths can be soothing. Try adding baking soda or dry oatmeal to the water to reduce itching. Do not bathe in hot water. Blister and rash care  Keep your rash covered with a loose bandage (dressing). Wear loose-fitting clothing to help ease the pain of material rubbing against the rash.  Keep your rash and blisters clean by washing the area with mild soap and cool water as told by your health care provider.  Check your rash every day for signs of infection. Check for: ? More redness, swelling, or pain. ? Fluid   or blood. ? Warmth. ? Pus or a bad smell.  Do not scratch your rash or pick at your blisters. To help avoid scratching: ? Keep your fingernails clean and cut short. ? Wear gloves or mittens while you sleep, if scratching is a problem. General instructions  Rest as told by your health care provider.  Keep all follow-up visits as told by your health care provider. This  is important.  Wash your hands often with soap and water. If soap and water are not available, use hand sanitizer. Doing this lowers your chance of getting a bacterial skin infection.  Before your blisters change into scabs, your shingles infection can cause chickenpox in people who have never had it or have never been vaccinated against it. To prevent this from happening, avoid contact with other people, especially: ? Babies. ? Pregnant women. ? Children who have eczema. ? Elderly people who have transplants. ? People who have chronic illnesses, such as cancer or AIDS. Contact a health care provider if:  Your pain is not relieved with prescribed medicines.  Your pain does not get better after the rash heals.  You have signs of infection in the rash area, such as: ? More redness, swelling, or pain around the rash. ? Fluid or blood coming from the rash. ? The rash area feeling warm to the touch. ? Pus or a bad smell coming from the rash. Get help right away if:  The rash is on your face or nose.  You have facial pain, pain around your eye area, or loss of feeling on one side of your face.  You have difficulty seeing.  You have ear pain or have ringing in your ear.  You have a loss of taste.  Your condition gets worse. Summary  Shingles, which is also known as herpes zoster, is an infection that causes a painful skin rash and fluid-filled blisters.  This condition is diagnosed with a skin exam. Skin or fluid samples may be taken from the blisters and examined before the diagnosis is made.  Keep your rash covered with a loose bandage (dressing). Wear loose-fitting clothing to help ease the pain of material rubbing against the rash.  Before your blisters change into scabs, your shingles infection can cause chickenpox in people who have never had it or have never been vaccinated against it. This information is not intended to replace advice given to you by your health care  provider. Make sure you discuss any questions you have with your health care provider. Document Revised: 07/21/2018 Document Reviewed: 12/01/2016 Elsevier Patient Education  2020 [employer]  

## 2019-11-27 NOTE — Assessment & Plan Note (Addendum)
Acute, ongoing.  Started 500 mg bid valcyclovir yesterday - to continue for 7 days, at least until rash improving.  Discussed use of gabapentin for neuropathy pain, declines for now but will be available to discuss with patient if she changes her mind.

## 2020-01-07 ENCOUNTER — Ambulatory Visit: Payer: 59 | Admitting: Nurse Practitioner

## 2020-01-07 ENCOUNTER — Encounter: Payer: Self-pay | Admitting: Nurse Practitioner

## 2020-01-07 ENCOUNTER — Other Ambulatory Visit: Payer: Self-pay

## 2020-01-07 VITALS — BP 127/79 | HR 62 | Temp 97.8°F | Resp 16 | Ht 67.0 in | Wt 118.2 lb

## 2020-01-07 DIAGNOSIS — F900 Attention-deficit hyperactivity disorder, predominantly inattentive type: Secondary | ICD-10-CM

## 2020-01-07 DIAGNOSIS — F339 Major depressive disorder, recurrent, unspecified: Secondary | ICD-10-CM

## 2020-01-07 MED ORDER — SERTRALINE HCL 100 MG PO TABS: 100.0000 mg | ORAL_TABLET | Freq: Every day | ORAL | 1 refills | Status: DC

## 2020-01-07 MED ORDER — SERTRALINE HCL 50 MG PO TABS: 50.0000 mg | ORAL_TABLET | Freq: Every day | ORAL | 1 refills | Status: DC

## 2020-01-07 NOTE — Progress Notes (Signed)
Established patient visit   Patient: Chelsea Velez   DOB: 03-Dec-1965   54 y.o. Female  MRN: [id number] Visit Date: 01/07/2020  Today's healthcare provider: Eulogio Bear, NP   Chief Complaint  Patient presents with  . Depression  . ADHD   Subjective    HPI  Depression, Follow-up  She  was last seen for this 2 months ago. Changes made at last visit include increase Sertraline 150mg  daily.  She reports doing very well on this medication.  Her relationship with her son is very strong, they recently moved him out to [place] and he plans to return a few times per year to help her with appointments that require a driver.   She reports excellent compliance with treatment. She is not having side effects.   She reports fair tolerance of treatment. Current symptoms include: difficulty concentrating and hypersomnia She feels she is Unchanged since last visit.  Depression screen [facility] 2/9 01/07/2020 11/05/2019 09/17/2019  Decreased Interest 0 2 0  Down, Depressed, Hopeless 0 1 0  PHQ - 2 Score 0 3 0  Altered sleeping 3 2 2   Tired, decreased energy 3 2 1   Change in appetite 3 1 1   Feeling bad or failure about yourself  0 0 0  Trouble concentrating 0 1 0  Moving slowly or fidgety/restless 0 0 0  Suicidal thoughts 0 0 0  PHQ-9 Score 9 9 4   Difficult doing work/chores Not difficult at all Very difficult Somewhat difficult    ----------------------------------------------------------------------------------------- Follow up for ADHD  The patient was last seen for this 2 months ago.  Patient reports she is  Changes made at last visit include no changes.  She is currently not taking the Adderall every day.  Reports since she got her braces on, is not able to eat well.  If she takes the Adderall without eating, she gets dry heaves.  She is planning on starting to prepare more foods and incorporate proteins into them so she can eat and take her medication as prescribed.  She  reports fair compliance with treatment. She feels that condition is Unchanged. She is having side effects. nausea  -----------------------------------------------------------------------------------------    Patient Active Problem List   Diagnosis Date Noted  . Herpes zoster without complication 76/72/0947  . Depression, recurrent ([place]) 10/05/2019  . Upper back pain on right side 10/05/2019  . Controlled substance agreement signed 10/05/2019  . Neck pain 09/18/2019  . Attention deficit hyperactivity disorder (ADHD), predominantly inattentive type 09/18/2019   Social History   Tobacco Use  . Smoking status: Former Smoker    Packs/day: 0.25    Years: 8.00    Pack years: 2.00    Types: Cigarettes    Quit date: 09/10/1992    Years since quitting: 27.3  . Smokeless tobacco: Never Used  Vaping Use  . Vaping Use: Never used  Substance Use Topics  . Alcohol use: Yes    Comment: socially  . Drug use: Yes    Types: Marijuana   No Known Allergies     Medications: Outpatient Medications Prior to Visit  Medication Sig  . valACYclovir (VALTREX) 500 MG tablet Take by mouth. As needed  . [DISCONTINUED] sertraline (ZOLOFT) 100 MG tablet Take 1 tablet (100 mg total) by mouth daily.  . [DISCONTINUED] sertraline (ZOLOFT) 50 MG tablet Take 1 tablet (50 mg total) by mouth daily.  Marland Kitchen amphetamine-dextroamphetamine (ADDERALL XR) 30 MG 24 hr capsule Take 1 capsule (30 mg total) by mouth  2 (two) times daily. (Patient not taking: Reported on 01/07/2020)  . amphetamine-dextroamphetamine (ADDERALL XR) 30 MG 24 hr capsule Take 1 capsule (30 mg total) by mouth 2 (two) times daily.  Marland Kitchen dextroamphetamine (DEXTROSTAT) 10 MG tablet Take 2 tablets (20 mg total) by mouth daily.  Marland Kitchen dextroamphetamine (DEXTROSTAT) 10 MG tablet Take 2 tablets (20 mg total) by mouth daily.  Marland Kitchen dextroamphetamine (DEXTROSTAT) 10 MG tablet Take 2 tablets (20 mg total) by mouth daily.   No facility-administered medications prior to  visit.    Review of Systems  Constitutional: Positive for fatigue and unexpected weight change. Negative for appetite change.  Respiratory: Negative for cough, shortness of breath and wheezing.   Cardiovascular: Negative for chest pain and palpitations.  Gastrointestinal: Negative.   Skin: Negative.   Psychiatric/Behavioral: Negative for agitation, confusion, decreased concentration and sleep disturbance. The patient is hyperactive. The patient is not nervous/anxious.     Objective    BP 127/79 (BP Location: Left Arm, Patient Position: Sitting, Cuff Size: Normal)   Pulse 62   Temp 97.8 F (36.6 C) (Oral)   Resp 16   Ht 5\' 7"  (1.702 m)   Wt 118 lb 3.2 oz (53.6 kg)   LMP 10/25/2011   SpO2 99%   BMI 18.51 kg/m  BP Readings from Last 3 Encounters:  01/07/20 127/79  11/27/19 123/73  11/05/19 (!) 92/56   Wt Readings from Last 3 Encounters:  01/07/20 118 lb 3.2 oz (53.6 kg)  11/27/19 121 lb (54.9 kg)  11/05/19 120 lb 6.4 oz (54.6 kg)      Physical Exam Vitals and nursing note reviewed.  Constitutional:      General: She is not in acute distress.    Appearance: Normal appearance.  Skin:    General: Skin is warm and dry.     Coloration: Skin is not jaundiced or pale.     Findings: No erythema.  Neurological:     Mental Status: She is alert and oriented to person, place, and time. Mental status is at baseline.     Motor: No weakness.     Gait: Gait normal.  Psychiatric:        Mood and Affect: Mood normal.        Behavior: Behavior normal.        Thought Content: Thought content normal.        Judgment: Judgment normal.      No results found for any visits on 01/07/20.  Assessment & Plan     ASSESSMENT:  1. Attention deficit hyperactivity disorder (ADHD), predominantly inattentive type Chronic, ongoing.  Patient reports not taking her Adderall regularly.  Encouraged increasing protein in diet to help with dry heaves.  Follow up after next prescription of Adderall  is picked up from pharmacy.  No refills needed today.  2. Depression, recurrent ([facility]) Chronic, stable on sertraline 150 mg daily.  Continue this medication for now; refills sent in today.      Return in about 2 months (around 03/08/2020).      I have reviewed this encounter including the documentation in this note. I am certifying that I agree with the content of this note as primary care provider.   Eulogio Bear, NP  [facility] [phone] (phone) [phone] (fax)  [place]

## 2020-01-07 NOTE — Patient Instructions (Addendum)
Chelsea Velez, Be sure to give [place] a call to make a follow up appointment for ADHD when you pick up your last refill of Adderall.     Attention Deficit Hyperactivity Disorder, Adult Attention deficit hyperactivity disorder (ADHD) is a mental health disorder that starts during childhood (neurodevelopmental disorder). For many people with ADHD, the disorder continues into the adult years. Treatment can help you manage your symptoms. What are the causes? The exact cause of ADHD is not known. Most experts believe genetics and environmental factors contribute to ADHD. What increases the risk? The following factors may make you more likely to develop this condition:  Having a family history of ADHD.  Being female.  Being born to a mother who smoked or drank alcohol during pregnancy.  Being exposed to lead or other toxins in the womb or early in life.  Being born before 81 weeks of pregnancy (prematurely) or at a low birth weight.  Having experienced a brain injury. What are the signs or symptoms? Symptoms of this condition depend on the type of ADHD. The two main types are inattentive and hyperactive-impulsive. Some people may have symptoms of both types. Symptoms of the inattentive type include:  Difficulty paying attention.  Making careless mistakes.  Not following instructions.  Being disorganized.  Avoiding tasks that require time and attention.  Losing and forgetting things.  Being easily distracted. Symptoms of the hyperactive-impulsive type include:  Restlessness.  Talking too much.  Interrupting.  Difficulty with: ? Sitting still. ? Feeling motivated. ? Relaxing. ? Waiting in line or waiting for a turn. In adults, this condition may lead to certain problems, such as:  Keeping jobs.  Performing tasks at work.  Having stable relationships.  Being on time or keeping to a schedule. How is this diagnosed? This condition is diagnosed based on your current symptoms and  your history of symptoms. The diagnosis can be made by a health care provider such as a primary care provider or a mental health care specialist. Your health care provider may use a symptom checklist or a behavior rating scale to evaluate your symptoms. He or she may also want to talk with people who have observed your behaviors throughout your life. How is this treated? This condition can be treated with medicines and behavior therapy. Medicines may be the best option to reduce impulsive behaviors and improve attention. Your health care provider may recommend:  Stimulant medicines. These are the most common medicines used for adult ADHD. They affect certain chemicals in the brain (neurotransmitters) and improve your ability to control your symptoms.  A non-stimulant medicine for adult ADHD (atomoxetine). This medicine increases a neurotransmitter called norepinephrine. It may take weeks to months to see effects from this medicine. Counseling and behavioral management are also important for treating ADHD. Counseling is often used along with medicine. Your health care provider may suggest:  Cognitive behavioral therapy (CBT). This type of therapy teaches you to replace negative thoughts and actions with positive thoughts and actions. When used as part of ADHD treatment, this therapy may also include: ? Coping strategies for organization, time management, impulse control, and stress reduction. ? Mindfulness and meditation training.  Behavioral management. You may work with a [facility] manager who is specially trained to help people with ADHD manage and organize activities and function more effectively. Follow these instructions at home: Medicines   Take over-the-counter and prescription medicines only as told by your health care provider.  Talk with your health care provider about the possible side  effects of your medicines and how to manage them. Lifestyle   Do not use drugs.  Do not drink alcohol  if: ? Your health care provider tells you not to drink. ? You are pregnant, may be pregnant, or are planning to become pregnant.  If you drink alcohol: ? Limit how much you use to:  0-1 drink a day for women.  0-2 drinks a day for men. ? Be aware of how much alcohol is in your drink. In the [place] one drink equals one 12 oz bottle of beer (355 mL), one 5 oz glass of wine (148 mL), or one 1 oz glass of hard liquor (44 mL).  Get enough sleep.  Eat a healthy diet.  Exercise regularly. Exercise can help to reduce stress and anxiety. General instructions  Learn as much as you can about adult ADHD, and work closely with your health care providers to find the treatments that work best for you.  Follow the same schedule each day.  Use reminder devices like notes, calendars, and phone apps to stay on time and organized.  Keep all follow-up visits as told by your health care provider and therapist. This is important. Where to find more information A health care provider may be able to recommend resources that are available online or over the phone. You could start with:  Attention Deficit Disorder Association (ADDA): PubAddiction.co.nz  National Institute of Mental Health [facility]): https://carter.com/ Contact a health care provider if:  Your symptoms continue to cause problems.  You have side effects from your medicine, such as: ? Repeated muscle twitches, coughing, or speech outbursts. ? Sleep problems. ? Loss of appetite. ? Dizziness. ? Unusually fast heartbeat. ? Stomach pains. ? Headaches.  You are struggling with anxiety, depression, or substance abuse. Get help right away if you:  Have a severe reaction to a medicine. If you ever feel like you may hurt yourself or others, or have thoughts about taking your own life, get help right away. You can go to the nearest emergency department or call:  Your local emergency services (911 in the [place]  A suicide crisis helpline, such as the  [place] at 430-862-5978. This is open 24 hours a day. Summary  ADHD is a mental health disorder that starts during childhood (neurodevelopmental disorder) and often continues into the adult years.  The exact cause of ADHD is not known. Most experts believe genetics and environmental factors contribute to ADHD.  There is no cure for ADHD, but treatment with medicine, cognitive behavioral therapy, or behavioral management can help you manage your condition. This information is not intended to replace advice given to you by your health care provider. Make sure you discuss any questions you have with your health care provider. Document Revised: 08/21/2018 Document Reviewed: 08/21/2018 Elsevier Patient Education  [place]. 22

## 2020-02-26 ENCOUNTER — Other Ambulatory Visit: Payer: Self-pay

## 2020-02-26 MED ORDER — DEXTROAMPHETAMINE SULFATE 10 MG PO TABS: 20.0000 mg | ORAL_TABLET | Freq: Every day | ORAL | 0 refills | Status: DC

## 2020-02-26 NOTE — Telephone Encounter (Signed)
Will refill; needs 3 month follow up appt around 04/07/20

## 2020-02-26 NOTE — Telephone Encounter (Signed)
Patient last seen 01/07/20 

## 2020-02-26 NOTE — Telephone Encounter (Signed)
PT HAS APT ON 04/07/2020 PT CONFIRMED APT.

## 2020-03-14 ENCOUNTER — Encounter: Payer: Self-pay | Admitting: Nurse Practitioner

## 2020-04-02 ENCOUNTER — Other Ambulatory Visit: Payer: Self-pay

## 2020-04-02 ENCOUNTER — Encounter: Payer: Self-pay | Admitting: Nurse Practitioner

## 2020-04-02 ENCOUNTER — Ambulatory Visit: Payer: 59 | Admitting: Nurse Practitioner

## 2020-04-02 DIAGNOSIS — N3090 Cystitis, unspecified without hematuria: Secondary | ICD-10-CM | POA: Insufficient documentation

## 2020-04-02 DIAGNOSIS — F900 Attention-deficit hyperactivity disorder, predominantly inattentive type: Secondary | ICD-10-CM | POA: Diagnosis not present

## 2020-04-02 DIAGNOSIS — N920 Excessive and frequent menstruation with regular cycle: Secondary | ICD-10-CM | POA: Insufficient documentation

## 2020-04-02 DIAGNOSIS — Z1211 Encounter for screening for malignant neoplasm of colon: Secondary | ICD-10-CM | POA: Insufficient documentation

## 2020-04-02 DIAGNOSIS — N912 Amenorrhea, unspecified: Secondary | ICD-10-CM | POA: Insufficient documentation

## 2020-04-02 MED ORDER — AMPHETAMINE-DEXTROAMPHET ER 30 MG PO CP24: 30.0000 mg | ORAL_CAPSULE | Freq: Two times a day (BID) | ORAL | 0 refills | Status: DC

## 2020-04-02 NOTE — Assessment & Plan Note (Signed)
Chronic, stable with current regimen.  Continue Adderall XR 30 MG BID, max dose.  PDMP reviewed today and refills sent through for 3 months.  UDS up to date and controlled substance contract on file.  Would benefit from reduction to discontinuation of medication in future.  Return in 3 months.

## 2020-04-02 NOTE — Progress Notes (Signed)
BP 106/66   Pulse 74   Temp 97.6 F (36.4 C) (Oral)   Ht 5\' 7"  (1.702 m)   Wt 118 lb (53.5 kg)   LMP 10/25/2011   SpO2 99%   BMI 18.48 kg/m    Subjective:    Patient ID: Chelsea Velez, female    DOB: 12/10/1965, 54 y.o.   MRN: [id number]  HPI: Chelsea Velez is a 54 y.o. female  Chief Complaint  Patient presents with  . ADHD   ADHD FOLLOW UP Continues on Adderall 30 MG two times daily.  Last Adderall fill was on 02/26/2020.  Has taken for many years on this dose.  She is interested in weaning off in the future. ADHD status: stable Satisfied with current therapy: yes Medication compliance:  good compliance Controlled substance contract: yes Previous psychiatry evaluation: yes -- diagnosed at [facility] [place] Previous medications: Adderall Taking meds on weekends/vacations: occasionally Work/school performance:  good Difficulty sustaining attention/completing tasks: no Distracted by extraneous stimuli: no Does not listen when spoken to: no  Fidgets with hands or feet: no Unable to stay in seat: no Blurts out/interrupts others: no ADHD Medication Side Effects: no    Decreased appetite: no    Headache: no    Sleeping disturbance pattern: no    Irritability: no    Rebound effects (worse than baseline) off medication: no    Anxiousness: no    Dizziness: no    Tics: no  Relevant past medical, surgical, family and social history reviewed and updated as indicated. Interim medical history since our last visit reviewed. Allergies and medications reviewed and updated.  Review of Systems  Constitutional: Negative for activity change, appetite change, diaphoresis, fatigue and fever.  Respiratory: Negative for cough, chest tightness and shortness of breath.   Cardiovascular: Negative for chest pain, palpitations and leg swelling.  Gastrointestinal: Negative.   Neurological: Negative.   Psychiatric/Behavioral: Negative.     Per HPI unless specifically indicated  above     Objective:    BP 106/66   Pulse 74   Temp 97.6 F (36.4 C) (Oral)   Ht 5\' 7"  (1.702 m)   Wt 118 lb (53.5 kg)   LMP 10/25/2011   SpO2 99%   BMI 18.48 kg/m   Wt Readings from Last 3 Encounters:  04/02/20 118 lb (53.5 kg)  01/07/20 118 lb 3.2 oz (53.6 kg)  11/27/19 121 lb (54.9 kg)    Physical Exam Vitals and nursing note reviewed.  Constitutional:      General: She is awake. She is not in acute distress.    Appearance: She is well-developed and well-groomed. She is not ill-appearing.  HENT:     Head: Normocephalic.     Right Ear: Hearing, ear canal and external ear normal.     Left Ear: Hearing, ear canal and external ear normal.  Eyes:     General: Lids are normal.        Right eye: No discharge.        Left eye: No discharge.     Conjunctiva/sclera: Conjunctivae normal.     Pupils: Pupils are equal, round, and reactive to light.  Neck:     Thyroid: No thyromegaly.     Vascular: No carotid bruit.  Cardiovascular:     Rate and Rhythm: Normal rate and regular rhythm.     Heart sounds: Normal heart sounds. No murmur heard. No gallop.   Pulmonary:     Effort: Pulmonary effort is normal.  No accessory muscle usage or respiratory distress.     Breath sounds: Normal breath sounds.  Abdominal:     General: Bowel sounds are normal.     Palpations: Abdomen is soft. There is no hepatomegaly or splenomegaly.  Musculoskeletal:     Cervical back: Normal range of motion and neck supple.     Right lower leg: No edema.     Left lower leg: No edema.  Skin:    General: Skin is warm and dry.  Neurological:     Mental Status: She is alert and oriented to person, place, and time.  Psychiatric:        Attention and Perception: Attention normal.        Mood and Affect: Mood normal.        Speech: Speech normal.        Behavior: Behavior normal. Behavior is cooperative.        Thought Content: Thought content normal.     Results for orders placed or performed in visit  on 11/05/19  CBC with Differential/Platelet  Result Value Ref Range   WBC 4.6 3.4 - 10.8 x10E3/uL   RBC 4.00 3.77 - 5.28 x10E6/uL   Hemoglobin 13.0 11.1 - 15.9 g/dL   Hematocrit 39.7 34.0 - 46.6 %   MCV 99 (H) 79 - 97 fL   MCH 32.5 26.6 - 33.0 pg   MCHC 32.7 31.5 - 35.7 g/dL   RDW 12.8 11.7 - 15.4 %   Platelets 246 150 - 450 x10E3/uL   Neutrophils 55 Not Estab. %   Lymphs 30 Not Estab. %   Monocytes 12 Not Estab. %   Eos 2 Not Estab. %   Basos 1 Not Estab. %   Neutrophils Absolute 2.5 1.4 - 7.0 x10E3/uL   Lymphocytes Absolute 1.4 0.7 - 3.1 x10E3/uL   Monocytes Absolute 0.6 0.1 - 0.9 x10E3/uL   EOS (ABSOLUTE) 0.1 0.0 - 0.4 x10E3/uL   Basophils Absolute 0.0 0.0 - 0.2 x10E3/uL   Immature Granulocytes 0 Not Estab. %   Immature Grans (Abs) 0.0 0.0 - 0.1 x10E3/uL  Comprehensive metabolic panel  Result Value Ref Range   Glucose 88 65 - 99 mg/dL   BUN 16 6 - 24 mg/dL   Creatinine, Ser 0.76 0.57 - 1.00 mg/dL   GFR calc non Af Amer 90 >59 mL/min/1.73   GFR calc Af Amer 104 >59 mL/min/1.73   BUN/Creatinine Ratio 21 9 - 23   Sodium 140 134 - 144 mmol/L   Potassium 5.1 3.5 - 5.2 mmol/L   Chloride 102 96 - 106 mmol/L   CO2 26 20 - 29 mmol/L   Calcium 9.4 8.7 - 10.2 mg/dL   Total Protein 6.6 6.0 - 8.5 g/dL   Albumin 4.6 3.8 - 4.9 g/dL   Globulin, Total 2.0 1.5 - 4.5 g/dL   Albumin/Globulin Ratio 2.3 (H) 1.2 - 2.2   Bilirubin Total 0.6 0.0 - 1.2 mg/dL   Alkaline Phosphatase 56 48 - 121 IU/L   AST 24 0 - 40 IU/L   ALT 29 0 - 32 IU/L  Lipid Panel w/o Chol/HDL Ratio  Result Value Ref Range   Cholesterol, Total 195 100 - 199 mg/dL   Triglycerides 77 0 - 149 mg/dL   HDL 58 >39 mg/dL   VLDL Cholesterol Cal 14 5 - 40 mg/dL   LDL Chol Calc (NIH) 123 (H) 0 - 99 mg/dL  TSH  Result Value Ref Range   TSH 0.993 0.450 - 4.500 uIU/mL  Hepatitis C antibody  Result Value Ref Range   Hep C Virus Ab <0.1 0.0 - 0.9 s/co ratio  UA/M w/rflx Culture, Routine   Specimen: Urine   Urine  Result  Value Ref Range   Specific Gravity, UA 1.025 1.005 - 1.030   pH, UA 5.5 5.0 - 7.5   Color, UA Yellow Yellow   Appearance Ur Clear Clear   Leukocytes,UA Negative Negative   Protein,UA Negative Negative/Trace   Glucose, UA Negative Negative   Ketones, UA Negative Negative   RBC, UA Negative Negative   Bilirubin, UA Negative Negative   Urobilinogen, Ur 0.2 0.2 - 1.0 mg/dL   Nitrite, UA Negative Negative  Urine drugs of abuse scrn w alc, routine (Ref Lab)  Result Value Ref Range   Amphetamines, Urine See Final Results Cutoff=1000 ng/mL   Barbiturate Quant, Ur Negative Cutoff=300 ng/mL   Benzodiazepine Quant, Ur Negative Cutoff=300 ng/mL   Cannabinoid Quant, Ur See Final Results Cutoff=50 ng/mL   Cocaine (Metab.) Negative Cutoff=300 ng/mL   Opiate Quant, Ur Negative Cutoff=300 ng/mL   PCP Quant, Ur Negative Cutoff=25 ng/mL   Methadone Screen, Urine Negative Cutoff=300 ng/mL   Propoxyphene Negative Cutoff=300 ng/mL   Ethanol, Urine Negative Cutoff=0.020 %  Amphetamine Conf, Ur  Result Value Ref Range   Amphetamines Positive (A) Cutoff=1000   Amphetamine Positive (A)    Amphetamine GC/MS Conf >3000 Cutoff=500 ng/mL   Methamphetamine Negative Cutoff=500  Panel [id number]  Result Value Ref Range   Cannabinoid GC/MS, Ur Positive (A) Cutoff=50   CARBOXY THC GC/MS CONF >300 Cutoff=10 ng/mL      Assessment & Plan:   Problem List Items Addressed This Visit      Other   Attention deficit hyperactivity disorder (ADHD), predominantly inattentive type    Chronic, stable with current regimen.  Continue Adderall XR 30 MG BID, max dose.  PDMP reviewed today and refills sent through for 3 months.  UDS up to date and controlled substance contract on file.  Would benefit from reduction to discontinuation of medication in future.  Return in 3 months.      Relevant Medications   amphetamine-dextroamphetamine (ADDERALL XR) 30 MG 24 hr capsule   amphetamine-dextroamphetamine (ADDERALL XR) 30 MG 24 hr  capsule (Start on 05/03/2020)       Follow up plan: Return in about 3 months (around 07/01/2020) for ADHD -- to meet new PCP.

## 2020-04-02 NOTE — Patient Instructions (Signed)

## 2020-04-07 ENCOUNTER — Ambulatory Visit: Payer: 59 | Admitting: Nurse Practitioner

## 2020-06-05 ENCOUNTER — Other Ambulatory Visit: Payer: Self-pay | Admitting: Nurse Practitioner

## 2020-06-05 NOTE — Telephone Encounter (Signed)
Requested medications are due for refill today yes  Requested medications are on the active medication list yes  Last refill 11/16  Last visit 03/2020  Future visit scheduled no  Notes to clinic Not Delegated

## 2020-08-20 ENCOUNTER — Other Ambulatory Visit: Payer: Self-pay | Admitting: Nurse Practitioner

## 2020-08-20 DIAGNOSIS — F900 Attention-deficit hyperactivity disorder, predominantly inattentive type: Secondary | ICD-10-CM

## 2020-08-20 NOTE — Telephone Encounter (Signed)
Requested medications are due for refill today.  yes  Requested medications are on the active medications list.  yes  Last refill. 06/03/2020  Future visit scheduled.   no  Notes to clinic.  Medication not delegated.

## 2020-08-21 ENCOUNTER — Other Ambulatory Visit: Payer: Self-pay

## 2020-08-21 ENCOUNTER — Telehealth: Payer: Self-pay

## 2020-08-21 DIAGNOSIS — F900 Attention-deficit hyperactivity disorder, predominantly inattentive type: Secondary | ICD-10-CM

## 2020-08-21 NOTE — Telephone Encounter (Signed)
Pt has apt on

## 2020-08-21 NOTE — Telephone Encounter (Signed)
Pt has apt on 08/28/2020 asking if medication can be sent to be bridged over.

## 2020-08-21 NOTE — Telephone Encounter (Signed)
Unable to lvm to make med refill apt

## 2020-08-21 NOTE — Telephone Encounter (Signed)
Pt verbalized understanding.

## 2020-08-21 NOTE — Telephone Encounter (Signed)
Pt is scheduled for 08/28/2020 ans is out of medications asking if medication can be sent to be bridged over. Please advise.

## 2020-08-22 ENCOUNTER — Telehealth: Payer: Self-pay | Admitting: Nurse Practitioner

## 2020-08-22 ENCOUNTER — Ambulatory Visit: Payer: Self-pay | Admitting: Nurse Practitioner

## 2020-08-22 NOTE — Telephone Encounter (Signed)
FYI Pt had apt for 08/22/2020 due to PCP sick she spoke with [place] to reschedule for 08/28/2020

## 2020-08-22 NOTE — Telephone Encounter (Signed)
Medication isn't due to be filled until 5/18.

## 2020-08-22 NOTE — Telephone Encounter (Signed)
She sent a mychart message that said she is out of medication.  It looks like she gets two different doses of Adderrall 30mg  BID and 10mg  daily.  Can we find out if this is correct, if so, ask her which one she is out of?

## 2020-08-22 NOTE — Telephone Encounter (Signed)
Pt has apt on 08/28/2020 spoke with Front office [place] as she was scheduled for 08/22/2020 but PCP was sick and apt had to be rescheduled.

## 2020-08-22 NOTE — Telephone Encounter (Signed)
I sent patient a mychart message.  Hold off on calling for now.

## 2020-08-25 MED ORDER — DEXTROAMPHETAMINE SULFATE 10 MG PO TABS: 20.0000 mg | ORAL_TABLET | Freq: Every day | ORAL | 0 refills | Status: DC

## 2020-08-27 NOTE — Progress Notes (Signed)
BP 127/72   Pulse 70   Temp (!) 97.4 F (36.3 C)   Wt 121 lb 8 oz (55.1 kg)   LMP 10/25/2011   SpO2 98%   BMI 19.03 kg/m    Subjective:    Patient ID: Chelsea Velez, female    DOB: November 22, 1965, 55 y.o.   MRN: [id number]  HPI: Chelsea Velez is a 55 y.o. female  Chief Complaint  Patient presents with  . ADHD   ADHD FOLLOW UP Patient would like to reestablish with a psychiatrist.  ADHD status: stable Satisfied with current therapy: yes would like to discuss coming off medication in the future. Medication compliance:  excellent compliance Controlled substance contract: yes Previous psychiatry evaluation: yes Previous medications: no adderall and adderall XR   Taking meds on weekends/vacations: yes Work/school performance:  good Difficulty sustaining attention/completing tasks: yes Distracted by extraneous stimuli: no Does not listen when spoken to: no  Fidgets with hands or feet: yes Unable to stay in seat: no Blurts out/interrupts others: no ADHD Medication Side Effects: yes    Decreased appetite: no    Headache: no    Sleeping disturbance pattern: yes when she takes 2 XR    Irritability: no    Rebound effects (worse than baseline) off medication: no    Anxiousness: no    Dizziness: no    Tics: no  Patient has not had a colonoscopy.   Relevant past medical, surgical, family and social history reviewed and updated as indicated. Interim medical history since our last visit reviewed. Allergies and medications reviewed and updated.  Review of Systems  Constitutional: Negative for appetite change.  Neurological: Negative for dizziness.       Denies Tics  Psychiatric/Behavioral: Positive for decreased concentration. Negative for agitation and sleep disturbance. The patient is not nervous/anxious.     Per HPI unless specifically indicated above     Objective:    BP 127/72   Pulse 70   Temp (!) 97.4 F (36.3 C)   Wt 121 lb 8 oz (55.1 kg)   LMP 10/25/2011    SpO2 98%   BMI 19.03 kg/m   Wt Readings from Last 3 Encounters:  08/28/20 121 lb 8 oz (55.1 kg)  04/02/20 118 lb (53.5 kg)  01/07/20 118 lb 3.2 oz (53.6 kg)    Physical Exam Vitals and nursing note reviewed.  Constitutional:      General: She is not in acute distress.    Appearance: Normal appearance. She is normal weight. She is not ill-appearing, toxic-appearing or diaphoretic.  HENT:     Head: Normocephalic.     Right Ear: External ear normal.     Left Ear: External ear normal.     Nose: Nose normal.     Mouth/Throat:     Mouth: Mucous membranes are moist.     Pharynx: Oropharynx is clear.  Eyes:     General:        Right eye: No discharge.        Left eye: No discharge.     Extraocular Movements: Extraocular movements intact.     Conjunctiva/sclera: Conjunctivae normal.     Pupils: Pupils are equal, round, and reactive to light.  Cardiovascular:     Rate and Rhythm: Normal rate and regular rhythm.     Heart sounds: No murmur heard.   Pulmonary:     Effort: Pulmonary effort is normal. No respiratory distress.     Breath sounds: Normal breath sounds. No  wheezing or rales.  Musculoskeletal:     Cervical back: Normal range of motion and neck supple.  Skin:    General: Skin is warm and dry.     Capillary Refill: Capillary refill takes less than 2 seconds.  Neurological:     General: No focal deficit present.     Mental Status: She is alert and oriented to person, place, and time. Mental status is at baseline.  Psychiatric:        Mood and Affect: Mood normal.        Behavior: Behavior normal.        Thought Content: Thought content normal.        Judgment: Judgment normal.     Results for orders placed or performed in visit on 11/05/19  CBC with Differential/Platelet  Result Value Ref Range   WBC 4.6 3.4 - 10.8 x10E3/uL   RBC 4.00 3.77 - 5.28 x10E6/uL   Hemoglobin 13.0 11.1 - 15.9 g/dL   Hematocrit 39.7 34.0 - 46.6 %   MCV 99 (H) 79 - 97 fL   MCH 32.5  26.6 - 33.0 pg   MCHC 32.7 31.5 - 35.7 g/dL   RDW 12.8 11.7 - 15.4 %   Platelets 246 150 - 450 x10E3/uL   Neutrophils 55 Not Estab. %   Lymphs 30 Not Estab. %   Monocytes 12 Not Estab. %   Eos 2 Not Estab. %   Basos 1 Not Estab. %   Neutrophils Absolute 2.5 1.4 - 7.0 x10E3/uL   Lymphocytes Absolute 1.4 0.7 - 3.1 x10E3/uL   Monocytes Absolute 0.6 0.1 - 0.9 x10E3/uL   EOS (ABSOLUTE) 0.1 0.0 - 0.4 x10E3/uL   Basophils Absolute 0.0 0.0 - 0.2 x10E3/uL   Immature Granulocytes 0 Not Estab. %   Immature Grans (Abs) 0.0 0.0 - 0.1 x10E3/uL  Comprehensive metabolic panel  Result Value Ref Range   Glucose 88 65 - 99 mg/dL   BUN 16 6 - 24 mg/dL   Creatinine, Ser 0.76 0.57 - 1.00 mg/dL   GFR calc non Af Amer 90 >59 mL/min/1.73   GFR calc Af Amer 104 >59 mL/min/1.73   BUN/Creatinine Ratio 21 9 - 23   Sodium 140 134 - 144 mmol/L   Potassium 5.1 3.5 - 5.2 mmol/L   Chloride 102 96 - 106 mmol/L   CO2 26 20 - 29 mmol/L   Calcium 9.4 8.7 - 10.2 mg/dL   Total Protein 6.6 6.0 - 8.5 g/dL   Albumin 4.6 3.8 - 4.9 g/dL   Globulin, Total 2.0 1.5 - 4.5 g/dL   Albumin/Globulin Ratio 2.3 (H) 1.2 - 2.2   Bilirubin Total 0.6 0.0 - 1.2 mg/dL   Alkaline Phosphatase 56 48 - 121 IU/L   AST 24 0 - 40 IU/L   ALT 29 0 - 32 IU/L  Lipid Panel w/o Chol/HDL Ratio  Result Value Ref Range   Cholesterol, Total 195 100 - 199 mg/dL   Triglycerides 77 0 - 149 mg/dL   HDL 58 >39 mg/dL   VLDL Cholesterol Cal 14 5 - 40 mg/dL   LDL Chol Calc (NIH) 123 (H) 0 - 99 mg/dL  TSH  Result Value Ref Range   TSH 0.993 0.450 - 4.500 uIU/mL  Hepatitis C antibody  Result Value Ref Range   Hep C Virus Ab <0.1 0.0 - 0.9 s/co ratio  UA/M w/rflx Culture, Routine   Specimen: Urine   Urine  Result Value Ref Range   Specific Gravity, UA 1.025  1.005 - 1.030   pH, UA 5.5 5.0 - 7.5   Color, UA Yellow Yellow   Appearance Ur Clear Clear   Leukocytes,UA Negative Negative   Protein,UA Negative Negative/Trace   Glucose, UA Negative  Negative   Ketones, UA Negative Negative   RBC, UA Negative Negative   Bilirubin, UA Negative Negative   Urobilinogen, Ur 0.2 0.2 - 1.0 mg/dL   Nitrite, UA Negative Negative  Urine drugs of abuse scrn w alc, routine (Ref Lab)  Result Value Ref Range   Amphetamines, Urine See Final Results Cutoff=1000 ng/mL   Barbiturate Quant, Ur Negative Cutoff=300 ng/mL   Benzodiazepine Quant, Ur Negative Cutoff=300 ng/mL   Cannabinoid Quant, Ur See Final Results Cutoff=50 ng/mL   Cocaine (Metab.) Negative Cutoff=300 ng/mL   Opiate Quant, Ur Negative Cutoff=300 ng/mL   PCP Quant, Ur Negative Cutoff=25 ng/mL   Methadone Screen, Urine Negative Cutoff=300 ng/mL   Propoxyphene Negative Cutoff=300 ng/mL   Ethanol, Urine Negative Cutoff=0.020 %  Amphetamine Conf, Ur  Result Value Ref Range   Amphetamines Positive (A) Cutoff=1000   Amphetamine Positive (A)    Amphetamine GC/MS Conf >3000 Cutoff=500 ng/mL   Methamphetamine Negative Cutoff=500  Panel [id number]  Result Value Ref Range   Cannabinoid GC/MS, Ur Positive (A) Cutoff=50   CARBOXY THC GC/MS CONF >300 Cutoff=10 ng/mL      Assessment & Plan:   Problem List Items Addressed This Visit      Other   Attention deficit hyperactivity disorder (ADHD), predominantly inattentive type - Primary    Chronic, stable with current regimen.  Continue Adderall XR 30 MG daily.  PDMP reviewed today and refills sent through for 3 months.  UDS up to date and controlled substance contract on file.  Would benefit from reduction to discontinuation of medication in future.  Return in 3 months.      Relevant Medications   amphetamine-dextroamphetamine (ADDERALL XR) 30 MG 24 hr capsule (Start on 10/28/2020)   amphetamine-dextroamphetamine (ADDERALL XR) 30 MG 24 hr capsule (Start on 09/28/2020)   Other Relevant Orders   Ambulatory referral to Psychiatry    Other Visit Diagnoses    Screening for colon cancer       Relevant Orders   Ambulatory referral to  Gastroenterology       Follow up plan: Return in about 3 months (around 11/28/2020) for ADHD FU.

## 2020-08-28 ENCOUNTER — Ambulatory Visit: Payer: BC Managed Care – PPO | Admitting: Nurse Practitioner

## 2020-08-28 ENCOUNTER — Encounter: Payer: Self-pay | Admitting: Nurse Practitioner

## 2020-08-28 ENCOUNTER — Other Ambulatory Visit: Payer: Self-pay

## 2020-08-28 ENCOUNTER — Ambulatory Visit: Payer: 59 | Admitting: Internal Medicine

## 2020-08-28 VITALS — BP 127/72 | HR 70 | Temp 97.4°F | Wt 121.5 lb

## 2020-08-28 DIAGNOSIS — Z1211 Encounter for screening for malignant neoplasm of colon: Secondary | ICD-10-CM

## 2020-08-28 DIAGNOSIS — F900 Attention-deficit hyperactivity disorder, predominantly inattentive type: Secondary | ICD-10-CM

## 2020-08-28 MED ORDER — AMPHETAMINE-DEXTROAMPHET ER 30 MG PO CP24: 30.0000 mg | ORAL_CAPSULE | Freq: Every day | ORAL | 0 refills | Status: DC

## 2020-08-28 NOTE — Assessment & Plan Note (Signed)
Chronic, stable with current regimen.  Continue Adderall XR 30 MG daily.  PDMP reviewed today and refills sent through for 3 months.  UDS up to date and controlled substance contract on file.  Would benefit from reduction to discontinuation of medication in future.  Return in 3 months.

## 2020-09-15 ENCOUNTER — Other Ambulatory Visit: Payer: Self-pay

## 2020-09-15 MED ORDER — PEG 3350-KCL-NA BICARB-NACL 420 G PO SOLR: ORAL | 0 refills | Status: DC

## 2020-09-23 ENCOUNTER — Encounter: Payer: Self-pay | Admitting: Gastroenterology

## 2020-09-24 ENCOUNTER — Ambulatory Visit: Payer: BC Managed Care – PPO | Admitting: Certified Registered"

## 2020-09-24 ENCOUNTER — Ambulatory Visit
Admission: RE | Admit: 2020-09-24 | Discharge: 2020-09-24 | Disposition: A | Discharge status: Home / Self Care | Payer: BC Managed Care – PPO | Source: Ambulatory Visit | Attending: Gastroenterology | Admitting: Gastroenterology

## 2020-09-24 ENCOUNTER — Encounter
Admission: RE | Disposition: A | Discharge status: Home / Self Care | Payer: Self-pay | Source: Ambulatory Visit | Attending: Gastroenterology

## 2020-09-24 ENCOUNTER — Encounter: Payer: Self-pay | Admitting: Gastroenterology

## 2020-09-24 DIAGNOSIS — Z87891 Personal history of nicotine dependence: Secondary | ICD-10-CM | POA: Insufficient documentation

## 2020-09-24 DIAGNOSIS — Z1211 Encounter for screening for malignant neoplasm of colon: Secondary | ICD-10-CM | POA: Diagnosis not present

## 2020-09-24 DIAGNOSIS — K635 Polyp of colon: Secondary | ICD-10-CM | POA: Diagnosis not present

## 2020-09-24 DIAGNOSIS — Z79899 Other long term (current) drug therapy: Secondary | ICD-10-CM | POA: Insufficient documentation

## 2020-09-24 DIAGNOSIS — Z8249 Family history of ischemic heart disease and other diseases of the circulatory system: Secondary | ICD-10-CM | POA: Diagnosis not present

## 2020-09-24 DIAGNOSIS — D122 Benign neoplasm of ascending colon: Secondary | ICD-10-CM | POA: Diagnosis not present

## 2020-09-24 DIAGNOSIS — Z833 Family history of diabetes mellitus: Secondary | ICD-10-CM | POA: Insufficient documentation

## 2020-09-24 DIAGNOSIS — Z803 Family history of malignant neoplasm of breast: Secondary | ICD-10-CM | POA: Insufficient documentation

## 2020-09-24 DIAGNOSIS — Z832 Family history of diseases of the blood and blood-forming organs and certain disorders involving the immune mechanism: Secondary | ICD-10-CM | POA: Insufficient documentation

## 2020-09-24 DIAGNOSIS — D125 Benign neoplasm of sigmoid colon: Secondary | ICD-10-CM | POA: Insufficient documentation

## 2020-09-24 HISTORY — PX: COLONOSCOPY: SHX5424

## 2020-09-24 SURGERY — COLONOSCOPY
Anesthesia: General

## 2020-09-24 MED ORDER — FENTANYL CITRATE (PF) 100 MCG/2ML IJ SOLN
INTRAMUSCULAR | Status: AC
  Filled 2020-09-24: qty 2

## 2020-09-24 MED ORDER — LIDOCAINE 2% (20 MG/ML) 5 ML SYRINGE
INTRAMUSCULAR | Status: DC | PRN
  Administered 2020-09-24: 25 mg via INTRAVENOUS

## 2020-09-24 MED ORDER — SODIUM CHLORIDE 0.9 % IV SOLN
INTRAVENOUS | Status: DC
  Administered 2020-09-24: 1000 mL via INTRAVENOUS

## 2020-09-24 MED ORDER — PROPOFOL 10 MG/ML IV BOLUS
INTRAVENOUS | Status: DC | PRN
  Administered 2020-09-24 (×3): 50 mg via INTRAVENOUS

## 2020-09-24 MED ORDER — PROPOFOL 500 MG/50ML IV EMUL
INTRAVENOUS | Status: DC | PRN
  Administered 2020-09-24: 120 ug/kg/min via INTRAVENOUS

## 2020-09-24 MED ORDER — FENTANYL CITRATE (PF) 100 MCG/2ML IJ SOLN
INTRAMUSCULAR | Status: DC | PRN
  Administered 2020-09-24 (×2): 50 ug via INTRAVENOUS

## 2020-09-24 NOTE — Op Note (Signed)
[facility] Gastroenterology Patient Name: Chelsea Velez Procedure Date: 09/24/2020 2:17 PM MRN: [id number] Account #: [id number] Date of Birth: July 25, 1965 Admit Type: Outpatient Age: 55 Room: [facility] ENDO ROOM 3 Gender: Female Note Status: Finalized Procedure:             Colonoscopy Indications:           Screening for colorectal malignant neoplasm Providers:             Jonathon Bellows MD, MD Referring MD:          Jon Billings (Referring MD) Medicines:             Monitored Anesthesia Care Complications:         No immediate complications. Procedure:             Pre-Anesthesia Assessment:                        - Prior to the procedure, a History and Physical was                         performed, and patient medications, allergies and                         sensitivities were reviewed. The patient's tolerance                         of previous anesthesia was reviewed.                        - The risks and benefits of the procedure and the                         sedation options and risks were discussed with the                         patient. All questions were answered and informed                         consent was obtained.                        - ASA Grade Assessment: II - A patient with mild                         systemic disease.                        After obtaining informed consent, the colonoscope was                         passed under direct vision. Throughout the procedure,                         the patient's blood pressure, pulse, and oxygen                         saturations were monitored continuously. The                         Colonoscope was introduced through the anus and  advanced to the the cecum, identified by the                         appendiceal orifice. The colonoscopy was performed                         with moderate difficulty due to significant looping.                         The patient  tolerated the procedure well. The quality                         of the bowel preparation was excellent. Findings:      The perianal and digital rectal examinations were normal.      Two sessile polyps were found in the sigmoid colon. The polyps were 4 to       5 mm in size. These polyps were removed with a cold snare. Resection and       retrieval were complete.      Two sessile polyps were found in the ascending colon. The polyps were 4       to 5 mm in size. These polyps were removed with a cold snare. Resection       and retrieval were complete.      Two sessile polyps were found in the ascending colon. The polyps were 3       to 4 mm in size. These polyps were removed with a jumbo cold forceps.       Resection and retrieval were complete.      The exam was otherwise without abnormality on direct and retroflexion       views. Impression:            - Two 4 to 5 mm polyps in the sigmoid colon, removed                         with a cold snare. Resected and retrieved.                        - Two 4 to 5 mm polyps in the ascending colon, removed                         with a cold snare. Resected and retrieved.                        - Two 3 to 4 mm polyps in the ascending colon, removed                         with a jumbo cold forceps. Resected and retrieved.                        - The examination was otherwise normal on direct and                         retroflexion views. Recommendation:        - Discharge patient to home (with escort).                        - Resume previous  diet.                        - Continue present medications.                        - Await pathology results.                        - Repeat colonoscopy for surveillance based on                         pathology results. Procedure Code(s):     --- Professional ---                        [phone], Colonoscopy, flexible; with removal of                         tumor(s), polyp(s), or other lesion(s) by  snare                         technique                        [id number], 67, Colonoscopy, flexible; with biopsy, single                         or multiple Diagnosis Code(s):     --- Professional ---                        Z12.11, Encounter for screening for malignant neoplasm                         of colon                        K63.5, Polyp of colon CPT copyright 2019 American Medical Association. All rights reserved. The codes documented in this report are preliminary and upon coder review may  be revised to meet current compliance requirements. Jonathon Bellows, MD Jonathon Bellows MD, MD 09/24/2020 3:00:36 PM This report has been signed electronically. Number of Addenda: 0 Note Initiated On: 09/24/2020 2:17 PM Scope Withdrawal Time: 0 hours 16 minutes 11 seconds  Total Procedure Duration: 0 hours 30 minutes 46 seconds  Estimated Blood Loss:  Estimated blood loss: none.      [facility]

## 2020-09-24 NOTE — Transfer of Care (Signed)
Immediate Anesthesia Transfer of Care Note  Patient: Chelsea Velez  Procedure(s) Performed: COLONOSCOPY  Patient Location: PACU and Endoscopy Unit  Anesthesia Type:General  Level of Consciousness: drowsy and patient cooperative  Airway & Oxygen Therapy: Patient Spontanous Breathing  Post-op Assessment: Report given to RN and Post -op Vital signs reviewed and stable  Post vital signs: Reviewed and stable  Last Vitals:  Vitals Value Taken Time  BP 103/87 09/24/20 1504  Temp    Pulse    Resp 16 09/24/20 1504  SpO2 99 % 09/24/20 1504    Last Pain:  Vitals:   09/24/20 1335  PainSc: 0-No pain      Patients Stated Pain Goal: 0 (86/75/44 9201)  Complications: No notable events documented.

## 2020-09-24 NOTE — Anesthesia Preprocedure Evaluation (Signed)
Anesthesia Evaluation  Patient identified by MRN, date of birth, ID band Patient awake    Reviewed: Allergy & Precautions, NPO status , Patient's Chart, lab work & pertinent test results  Airway Mallampati: II  TM Distance: >3 FB Neck ROM: Full    Dental no notable dental hx.    Pulmonary neg pulmonary ROS, Patient abstained from smoking., former smoker,    Pulmonary exam normal        Cardiovascular negative cardio ROS Normal cardiovascular exam     Neuro/Psych PSYCHIATRIC DISORDERS Anxiety Depression negative neurological ROS     GI/Hepatic negative GI ROS, Neg liver ROS, Bowel prep,  Endo/Other  negative endocrine ROS  Renal/GU negative Renal ROS  negative genitourinary   Musculoskeletal  (+) Arthritis ,   Abdominal   Peds negative pediatric ROS (+)  Hematology negative hematology ROS (+)   Anesthesia Other Findings ADHD    Anxiety    Arthritis  hands - tx with otc meds prn  Depression       Reproductive/Obstetrics negative OB ROS                             Anesthesia Physical Anesthesia Plan  ASA: 2  Anesthesia Plan: General   Post-op Pain Management:    Induction: Intravenous  PONV Risk Score and Plan: 2 and Propofol infusion and TIVA  Airway Management Planned: Natural Airway and Nasal Cannula  Additional Equipment:   Intra-op Plan:   Post-operative Plan:   Informed Consent: I have reviewed the patients History and Physical, chart, labs and discussed the procedure including the risks, benefits and alternatives for the proposed anesthesia with the patient or authorized representative who has indicated his/her understanding and acceptance.       Plan Discussed with: CRNA, Anesthesiologist and Surgeon  Anesthesia Plan Comments:         Anesthesia Quick Evaluation

## 2020-09-24 NOTE — H&P (Signed)
Jonathon Bellows, MD [address], [place], [place], [place], [address] [address], [place], [place], [place], [address] Phone: [phone]  Fax: [phone]  Primary Care Physician:  Jon Billings, NP   Pre-Procedure History & Physical: HPI:  Chelsea Velez is a 55 y.o. female is here for an colonoscopy.   Past Medical History:  Diagnosis Date   ADHD    Anxiety    Arthritis    hands - tx with otc meds prn   Depression     Past Surgical History:  Procedure Laterality Date   BLADDER SUSPENSION  08/04/2011   Procedure: TRANSVAGINAL TAPE (TVT) PROCEDURE;  Surgeon: Daria Pastures, MD;  Location: [facility];  Service: Gynecology;  Laterality: N/A;   BLADDER SUSPENSION N/A    BREAST SURGERY     Right breast lumpectomy   CESAREAN SECTION  1995   CYSTOSCOPY  08/04/2011   Procedure: CYSTOSCOPY;  Surgeon: Daria Pastures, MD;  Location: [facility];  Service: Gynecology;  Laterality: N/A;   endosocpy     EYE SURGERY     [facility] Teeth Ext      Prior to Admission medications   Medication Sig Start Date End Date Taking? Authorizing Provider  amphetamine-dextroamphetamine (ADDERALL XR) 30 MG 24 hr capsule Take 1 capsule (30 mg total) by mouth daily. 10/28/20  Yes Jon Billings, NP  amphetamine-dextroamphetamine (ADDERALL XR) 30 MG 24 hr capsule Take 1 capsule (30 mg total) by mouth daily. 09/28/20  Yes Jon Billings, NP  amphetamine-dextroamphetamine (ADDERALL XR) 30 MG 24 hr capsule Take 1 capsule (30 mg total) by mouth daily. 08/28/20  Yes Jon Billings, NP  dextroamphetamine (DEXTROSTAT) 10 MG tablet Take 2 tablets (20 mg total) by mouth daily. 08/25/20  Yes Jon Billings, NP  polyethylene glycol-electrolytes (GAVILYTE-N WITH FLAVOR PACK) 420 g solution Drink one 8 oz glass every 20 mins until entire container is finished starting at 5:00pm on 09/23/20 09/15/20  Yes Jonathon Bellows, MD  sertraline (ZOLOFT) 100 MG tablet Take 1 tablet (100 mg total) by mouth daily.  01/07/20  Yes Eulogio Bear, NP  sertraline (ZOLOFT) 50 MG tablet Take 1 tablet (50 mg total) by mouth daily. 01/07/20  Yes Eulogio Bear, NP  valACYclovir (VALTREX) 500 MG tablet Take by mouth. As needed   Yes [provider]    Allergies as of 09/11/2020   (No Known Allergies)    Family History  Problem Relation Age of Onset   Cervical cancer Mother    Breast cancer Mother    Diabetes Mother    Hypertension Mother    Clotting disorder Mother     Social History   Socioeconomic History   Marital status: Single    Spouse name: Not on file   Number of children: Not on file   Years of education: Not on file   Highest education level: Not on file  Occupational History   Not on file  Tobacco Use   Smoking status: Former    Packs/day: 0.25    Years: 8.00    Pack years: 2.00    Types: Cigarettes    Quit date: 09/10/1992    Years since quitting: 28.0   Smokeless tobacco: Never  Vaping Use   Vaping Use: Never used  Substance and Sexual Activity   Alcohol use: Yes    Comment: socially   Drug use: Yes    Types: Marijuana, Amphetamines    Comment: takes Adderal   Sexual activity: Not  Currently    Birth control/protection: None  Other Topics Concern   Not on file  Social History Narrative   Not on file   Social Determinants of Health   Financial Resource Strain: Not on file  Food Insecurity: Not on file  Transportation Needs: Not on file  Physical Activity: Not on file  Stress: Not on file  Social Connections: Not on file  Intimate Partner Violence: Not on file    Review of Systems: See HPI, otherwise negative ROS  Physical Exam: BP 124/67   Temp 97.6 F (36.4 C)   Resp 18   Ht 5\' 7"  (1.702 m)   Wt 51.7 kg   LMP 10/25/2011   SpO2 100%   BMI 17.85 kg/m  General:   Alert,  pleasant and cooperative in NAD Head:  Normocephalic and atraumatic. Neck:  Supple; no masses or thyromegaly. Lungs:  Clear throughout to auscultation, normal  respiratory effort.    Heart:  +S1, +S2, Regular rate and rhythm, No edema. Abdomen:  Soft, nontender and nondistended. Normal bowel sounds, without guarding, and without rebound.   Neurologic:  Alert and  oriented x4;  grossly normal neurologically.  Impression/Plan: Chelsea Velez is here for an colonoscopy to be performed for Screening colonoscopy average risk   Risks, benefits, limitations, and alternatives regarding  colonoscopy have been reviewed with the patient.  Questions have been answered.  All parties agreeable.   Jonathon Bellows, MD  09/24/2020, 2:19 PM

## 2020-09-25 ENCOUNTER — Encounter: Payer: Self-pay | Admitting: Gastroenterology

## 2020-09-25 NOTE — Anesthesia Postprocedure Evaluation (Signed)
Anesthesia Post Note  Patient: Chelsea Velez  Procedure(s) Performed: COLONOSCOPY  Patient location during evaluation: Phase II Anesthesia Type: General Level of consciousness: awake and alert, awake and oriented Pain management: pain level controlled Vital Signs Assessment: post-procedure vital signs reviewed and stable Respiratory status: spontaneous breathing, nonlabored ventilation and respiratory function stable Cardiovascular status: blood pressure returned to baseline and stable Postop Assessment: no apparent nausea or vomiting Anesthetic complications: no   No notable events documented.   Last Vitals:  Vitals:   09/24/20 1504 09/24/20 1510  BP: 103/87 120/64  Pulse:  69  Resp: 16 20  Temp:    SpO2: 99%     Last Pain:  Vitals:   09/24/20 1510  TempSrc:   PainSc: 0-No pain                 Phill Mutter

## 2020-09-26 LAB — SURGICAL PATHOLOGY

## 2020-09-29 ENCOUNTER — Encounter: Payer: Self-pay | Admitting: Gastroenterology

## 2020-10-14 ENCOUNTER — Other Ambulatory Visit: Payer: Self-pay | Admitting: Nurse Practitioner

## 2020-10-14 NOTE — Telephone Encounter (Signed)
Pt is scheduled 8/22

## 2020-10-14 NOTE — Telephone Encounter (Signed)
Requested medication (s) are due for refill today: yes  Requested medication (s) are on the active medication list: yes  Last refill:  08/25/2020  Future visit scheduled: yes  Notes to clinic:  this refill cannot be delegated    Requested Prescriptions  Pending Prescriptions Disp Refills   dextroamphetamine (DEXTROSTAT) 10 MG tablet [Pharmacy Med Name: DEXTROAMPHETAMINE 10 MG TAB] 30 tablet 0    Sig: Take 2 tablets (20 mg total) by mouth daily.      Not Delegated - Psychiatry:  Stimulants/ADHD Failed - 10/14/2020 10:58 AM      Failed - This refill cannot be delegated      Failed - Urine Drug Screen completed in last 360 days      Passed - Valid encounter within last 3 months    Recent Outpatient Visits           1 month ago Attention deficit hyperactivity disorder (ADHD), predominantly inattentive type   [facility] Jon Billings, NP   6 months ago Attention deficit hyperactivity disorder (ADHD), predominantly inattentive type   [facility], Henrine Screws T, NP   9 months ago Attention deficit hyperactivity disorder (ADHD), predominantly inattentive type   [facility] Eulogio Bear, NP   10 months ago Herpes zoster without complication   [facility] Eulogio Bear, NP   11 months ago Annual physical exam   [facility] Eulogio Bear, NP       Future Appointments             In 1 month Jon Billings, NP [facility], [place]

## 2020-10-17 ENCOUNTER — Other Ambulatory Visit: Payer: Self-pay | Admitting: Nurse Practitioner

## 2020-10-17 NOTE — Telephone Encounter (Signed)
Requested medication (s) are due for refill today -no  Requested medication (s) are on the active medication list -yes  Future visit scheduled -yes  Last refill: 08/25/20  Notes to clinic: Request RF- non delegated Rx- denied too soon last request  Requested Prescriptions  Pending Prescriptions Disp Refills   dextroamphetamine (DEXTROSTAT) 10 MG tablet [Pharmacy Med Name: DEXTROAMPHETAMINE 10 MG TAB] 30 tablet 0    Sig: Take 2 tablets (20 mg total) by mouth daily.      Not Delegated - Psychiatry:  Stimulants/ADHD Failed - 10/17/2020  3:20 PM      Failed - This refill cannot be delegated      Failed - Urine Drug Screen completed in last 360 days      Passed - Valid encounter within last 3 months    Recent Outpatient Visits           1 month ago Attention deficit hyperactivity disorder (ADHD), predominantly inattentive type   [facility] Jon Billings, NP   6 months ago Attention deficit hyperactivity disorder (ADHD), predominantly inattentive type   [place], Henrine Screws T, NP   9 months ago Attention deficit hyperactivity disorder (ADHD), predominantly inattentive type   [facility] Eulogio Bear, NP   10 months ago Herpes zoster without complication   [facility] Eulogio Bear, NP   11 months ago Annual physical exam   [facility] Eulogio Bear, NP       Future Appointments             In 1 month Jon Billings, NP [place]                 Requested Prescriptions  Pending Prescriptions Disp Refills   dextroamphetamine (DEXTROSTAT) 10 MG tablet [Pharmacy Med Name: DEXTROAMPHETAMINE 10 MG TAB] 30 tablet 0    Sig: Take 2 tablets (20 mg total) by mouth daily.      Not Delegated - Psychiatry:  Stimulants/ADHD Failed - 10/17/2020  3:20 PM      Failed - This refill cannot be delegated      Failed - Urine Drug Screen completed in last 360 days       Passed - Valid encounter within last 3 months    Recent Outpatient Visits           1 month ago Attention deficit hyperactivity disorder (ADHD), predominantly inattentive type   [facility] Jon Billings, NP   6 months ago Attention deficit hyperactivity disorder (ADHD), predominantly inattentive type   [place], Henrine Screws T, NP   9 months ago Attention deficit hyperactivity disorder (ADHD), predominantly inattentive type   [facility] Eulogio Bear, NP   10 months ago Herpes zoster without complication   [facility] Eulogio Bear, NP   11 months ago Annual physical exam   [facility] Eulogio Bear, NP       Future Appointments             In 1 month Jon Billings, NP [facility], [place]

## 2020-10-20 NOTE — Telephone Encounter (Signed)
Spoke with patient and she states she takes 30 mg extended release in the morning and then she takes 2 of the 10 mg daily.

## 2020-10-20 NOTE — Telephone Encounter (Signed)
PDMP checked.

## 2020-10-20 NOTE — Telephone Encounter (Signed)
Pt is scheduled 8/22

## 2020-10-20 NOTE — Telephone Encounter (Signed)
Can we find out if the patient is using the 10mg  of adderrall? If so, is she using it daily?

## 2020-11-05 DIAGNOSIS — F331 Major depressive disorder, recurrent, moderate: Secondary | ICD-10-CM | POA: Diagnosis not present

## 2020-11-18 ENCOUNTER — Ambulatory Visit
Admission: EM | Admit: 2020-11-18 | Discharge: 2020-11-18 | Disposition: A | Discharge status: Home / Self Care | Payer: BC Managed Care – PPO | Attending: Emergency Medicine | Admitting: Emergency Medicine

## 2020-11-18 ENCOUNTER — Ambulatory Visit (INDEPENDENT_AMBULATORY_CARE_PROVIDER_SITE_OTHER): Payer: BC Managed Care – PPO

## 2020-11-18 DIAGNOSIS — M79672 Pain in left foot: Secondary | ICD-10-CM | POA: Diagnosis not present

## 2020-11-18 DIAGNOSIS — S92352A Displaced fracture of fifth metatarsal bone, left foot, initial encounter for closed fracture: Secondary | ICD-10-CM | POA: Diagnosis not present

## 2020-11-18 DIAGNOSIS — M7989 Other specified soft tissue disorders: Secondary | ICD-10-CM | POA: Diagnosis not present

## 2020-11-18 DIAGNOSIS — W19XXXA Unspecified fall, initial encounter: Secondary | ICD-10-CM | POA: Diagnosis not present

## 2020-11-18 NOTE — ED Provider Notes (Signed)
[facility]    CSN: [id number] Arrival date & time: 11/18/20  1530      History   Chief Complaint Chief Complaint  Patient presents with   Foot Injury    HPI Chelsea Velez is a 55 y.o. female.   HPI  55 year old female here for evaluation of left foot injury.  Patient reports that 10 days ago she went to step over some trim work in her garage and caught her left toes on the trim work which caused her to fall.  She states that she fell and landed with her left foot up underneath her.  She reports that she had bruising to the toes of her foot and along the lateral edge.  She states that the bruising has improved.  She is now complaining of pain along the lateral edge of her foot.  She is able to bear weight but with pain.  She does have intermittent numbness to the top of her foot but she is not having any at present.  She denies any tingling in her toes.  Past Medical History:  Diagnosis Date   ADHD    Anxiety    Arthritis    hands - tx with otc meds prn   Depression     Patient Active Problem List   Diagnosis Date Noted   Amenorrhea 04/02/2020   Colon cancer screening 04/02/2020   Menorrhagia 04/02/2020   Herpes zoster without complication [id number]   Depression, recurrent ([place]) 10/05/2019   Upper back pain on right side 10/05/2019   Controlled substance agreement signed 10/05/2019   Neck pain 09/18/2019   Attention deficit hyperactivity disorder (ADHD), predominantly inattentive type 09/18/2019    Past Surgical History:  Procedure Laterality Date   BLADDER SUSPENSION  08/04/2011   Procedure: TRANSVAGINAL TAPE (TVT) PROCEDURE;  Surgeon: Daria Pastures, MD;  Location: [facility];  Service: Gynecology;  Laterality: N/A;   BLADDER SUSPENSION N/A    BREAST SURGERY     Right breast lumpectomy   CESAREAN SECTION  1995   COLONOSCOPY N/A 09/24/2020   Procedure: COLONOSCOPY;  Surgeon: Jonathon Bellows, MD;  Location: [facility] ENDOSCOPY;  Service: Gastroenterology;   Laterality: N/A;   CYSTOSCOPY  08/04/2011   Procedure: CYSTOSCOPY;  Surgeon: Daria Pastures, MD;  Location: [facility];  Service: Gynecology;  Laterality: N/A;   endosocpy     EYE SURGERY     [facility] Teeth Ext      OB History   No obstetric history on file.      Home Medications    Prior to Admission medications   Medication Sig Start Date End Date Taking? Authorizing Provider  amphetamine-dextroamphetamine (ADDERALL XR) 30 MG 24 hr capsule Take 1 capsule (30 mg total) by mouth daily. 10/28/20  Yes Jon Billings, NP  sertraline (ZOLOFT) 50 MG tablet Take 1 tablet (50 mg total) by mouth daily. 01/07/20  Yes Eulogio Bear, NP  dextroamphetamine (DEXTROSTAT) 10 MG tablet Take 2 tablets (20 mg total) by mouth daily. 10/20/20   Jon Billings, NP  polyethylene glycol-electrolytes (GAVILYTE-N WITH FLAVOR PACK) 420 g solution Drink one 8 oz glass every 20 mins until entire container is finished starting at 5:00pm on 09/23/20 09/15/20   Jonathon Bellows, MD  valACYclovir (VALTREX) 500 MG tablet Take by mouth. As needed    [provider]    Family History Family History  Problem Relation Age of Onset   Cervical cancer Mother    Breast cancer Mother  Diabetes Mother    Hypertension Mother    Clotting disorder Mother     Social History Social History   Tobacco Use   Smoking status: Former    Packs/day: 0.25    Years: 8.00    Pack years: 2.00    Types: Cigarettes    Quit date: 09/10/1992    Years since quitting: 28.2   Smokeless tobacco: Never  Vaping Use   Vaping Use: Never used  Substance Use Topics   Alcohol use: Yes    Comment: socially   Drug use: Yes    Types: Marijuana, Amphetamines    Comment: takes Adderal     Allergies   Patient has no known allergies.   Review of Systems Review of Systems  Constitutional:  Negative for activity change, appetite change and fever.  Musculoskeletal:  Positive for arthralgias and myalgias. Negative for joint  swelling.  Skin:  Positive for color change.  Neurological:  Positive for numbness. Negative for tremors.  Hematological: Negative.   Psychiatric/Behavioral: Negative.      Physical Exam Triage Vital Signs ED Triage Vitals  Enc Vitals Group     BP 11/18/20 1614 (!) 111/57     Pulse Rate 11/18/20 1614 70     Resp 11/18/20 1614 18     Temp 11/18/20 1614 98.5 F (36.9 C)     Temp Source 11/18/20 1614 Oral     SpO2 11/18/20 1614 98 %     Weight 11/18/20 1611 116 lb (52.6 kg)     Height 11/18/20 1611 '5\' 7"'$  (1.702 m)     Head Circumference --      Peak Flow --      Pain Score 11/18/20 1611 9     Pain Loc --      Pain Edu? --      Excl. in [place]? --    No data found.  Updated Vital Signs BP (!) 111/57 (BP Location: Left Arm)   Pulse 70   Temp 98.5 F (36.9 C) (Oral)   Resp 18   Ht '5\' 7"'$  (1.702 m)   Wt 116 lb (52.6 kg)   LMP 10/25/2011   SpO2 98%   BMI 18.17 kg/m   Visual Acuity Right Eye Distance:   Left Eye Distance:   Bilateral Distance:    Right Eye Near:   Left Eye Near:    Bilateral Near:     Physical Exam Vitals and nursing note reviewed.  Constitutional:      General: She is not in acute distress.    Appearance: Normal appearance. She is normal weight. She is not ill-appearing.  HENT:     Head: Normocephalic and atraumatic.  Cardiovascular:     Rate and Rhythm: Normal rate and regular rhythm.     Pulses: Normal pulses.     Heart sounds: Normal heart sounds. No murmur heard.   No gallop.  Pulmonary:     Effort: Pulmonary effort is normal.     Breath sounds: Normal breath sounds. No wheezing, rhonchi or rales.  Musculoskeletal:        General: Tenderness present. No swelling or deformity. Normal range of motion.  Skin:    General: Skin is warm and dry.     Capillary Refill: Capillary refill takes less than 2 seconds.     Findings: Bruising present.  Neurological:     General: No focal deficit present.     Mental Status: She is alert and oriented to  person, place, and  time.  Psychiatric:        Mood and Affect: Mood normal.        Behavior: Behavior normal.        Thought Content: Thought content normal.        Judgment: Judgment normal.     UC Treatments / Results  Labs (all labs ordered are listed, but only abnormal results are displayed) Labs Reviewed - No data to display  EKG   Radiology DG Foot Complete Left  Result Date: 11/18/2020 CLINICAL DATA:  Fall, lateral pain EXAM: LEFT FOOT - COMPLETE 3+ VIEW COMPARISON:  None. FINDINGS: Metatarsal there is an oblique fracture through the left 5th metatarsal. Mild displacement. Overlying soft tissue swelling. No subluxation or dislocation. Joint spaces maintained. IMPRESSION: Oblique mildly displaced left 5th metatarsal fracture. Electronically Signed   By: Rolm Baptise M.D.   On: 11/18/2020 17:02    Procedures Procedures (including critical care time)  Medications Ordered in UC Medications - No data to display  Initial Impression / Assessment and Plan / UC Course  I have reviewed the triage vital signs and the nursing notes.  Pertinent labs & imaging results that were available during my care of the patient were reviewed by me and considered in my medical decision making (see chart for details).  Patient is a nontoxic-appearing 55 year old female here for evaluation of left foot injury as outlined in the HPI above.  Her judicially occurred 10 days ago and she still complaining of pain on the lateral aspect over the base of the fifth metatarsal.  Physical exam reveals a left foot that is in normal anatomical alignment.  There is ecchymosis of the second through fifth toes.  They are not tender to palpation and there is no crepitus with palpation of the interphalangeal joints.  Patient is a full range of motion sensation in her toes.  DP and PT pulses are 2+.  There is no pain with palpation of the bones of the midfoot, the anterior talar mortise joint, palpation and compression of  the medial or lateral malleolus, palpation of the calcaneus or the Achilles tendon.  Patient does have tenderness with palpation of the base of the fifth metatarsal.  There is no overlying ecchymosis but there is some mild erythema.  Patient is able to bear weight but she walks on her heel and will not step heel-to-toe secondary to pain.  Patient's gait is steady.  Will obtain radiograph of left foot.  Left foot radiograph inability reviewed and evaluated by me.  Interpretation: There is a mildly displaced fracture of the fifth metatarsal.  There is no obvious fractures of any of the other metatarsals or tarsal bones.  Awaiting radiology overread.  Radiology interpretation is mildly displaced oblique left fifth metatarsal fracture.  Will place patient in a hard soled shoe and fit her for crutches and have her remain nonweightbearing.  We will have patient follow-up with podiatry  Final Clinical Impressions(s) / UC Diagnoses   Final diagnoses:  Displaced fracture of fifth metatarsal bone, left foot, initial encounter for closed fracture     Discharge Instructions      Use crutches to get around as you need to avoid weightbearing due to your broken foot.  Wear the postoperative shoe to minimize movement of the fracture pieces to prevent further damage.  Call Dr. Alvera Singh office in the morning to make an appointment for follow-up.  Use over-the-counter Tylenol and ibuprofen according to the package instructions as needed for pain.  ED Prescriptions   None    PDMP not reviewed this encounter.   Margarette Canada, NP 11/18/20 1712

## 2020-11-18 NOTE — ED Triage Notes (Signed)
Pt c/o multiple falls in the past 10 days. Pt states she has injured her left foot, mainly on the outer area. Pt there was swelling and bruising, this has improved. Pt states it is painful to walk. Pt does have full ROM to the ankle and toes. Pt came into clinic wearing an ortho boot.

## 2020-11-18 NOTE — Discharge Instructions (Addendum)
Use crutches to get around as you need to avoid weightbearing due to your broken foot.  Wear the postoperative shoe to minimize movement of the fracture pieces to prevent further damage.  Call Dr. Alvera Singh office in the morning to make an appointment for follow-up.  Use over-the-counter Tylenol and ibuprofen according to the package instructions as needed for pain.

## 2020-11-26 DIAGNOSIS — S92355A Nondisplaced fracture of fifth metatarsal bone, left foot, initial encounter for closed fracture: Secondary | ICD-10-CM | POA: Diagnosis not present

## 2020-11-26 DIAGNOSIS — S92353A Displaced fracture of fifth metatarsal bone, unspecified foot, initial encounter for closed fracture: Secondary | ICD-10-CM | POA: Insufficient documentation

## 2020-12-01 ENCOUNTER — Encounter: Payer: Self-pay | Admitting: Nurse Practitioner

## 2020-12-01 ENCOUNTER — Ambulatory Visit: Payer: BC Managed Care – PPO | Admitting: Nurse Practitioner

## 2020-12-01 ENCOUNTER — Other Ambulatory Visit: Payer: Self-pay

## 2020-12-01 VITALS — BP 148/71 | HR 83 | Temp 98.1°F | Ht 67.32 in | Wt 116.4 lb

## 2020-12-01 DIAGNOSIS — Z79899 Other long term (current) drug therapy: Secondary | ICD-10-CM | POA: Diagnosis not present

## 2020-12-01 DIAGNOSIS — F900 Attention-deficit hyperactivity disorder, predominantly inattentive type: Secondary | ICD-10-CM | POA: Diagnosis not present

## 2020-12-01 MED ORDER — AMPHETAMINE-DEXTROAMPHET ER 30 MG PO CP24: 30.0000 mg | ORAL_CAPSULE | Freq: Every day | ORAL | 0 refills | Status: DC

## 2020-12-01 MED ORDER — DEXTROAMPHETAMINE SULFATE 10 MG PO TABS: 20.0000 mg | ORAL_TABLET | Freq: Every day | ORAL | 0 refills | Status: DC

## 2020-12-01 NOTE — Progress Notes (Signed)
BP (!) 148/71   Pulse 83   Temp 98.1 F (36.7 C)   Ht 5' 7.32" (1.71 m)   Wt 116 lb 6 oz (52.8 kg)   LMP 10/25/2011   SpO2 97%   BMI 18.05 kg/m    Subjective:    Patient ID: Chelsea Velez, female    DOB: Oct 23, 1965, 55 y.o.   MRN: [id number]  HPI: Chelsea Velez is a 55 y.o. female  Chief Complaint  Patient presents with   ADHD   ADHD FOLLOW UP Patient has not seen the psychiatrist since our last visit. However, she did she see a therapist through her insurance virtually.  ADHD status: stable Satisfied with current therapy: yes would like to discuss coming off medication in the future. Medication compliance:  excellent compliance Controlled substance contract: Updated today Previous psychiatry evaluation:  yes Previous medications: no adderall and adderall XR   Taking meds on weekends/vacations: yes Work/school performance:  good Difficulty sustaining attention/completing tasks: yes Distracted by extraneous stimuli: no Does not listen when spoken to: no  Fidgets with hands or feet: yes Unable to stay in seat: no Blurts out/interrupts others: no ADHD Medication Side Effects: yes    Decreased appetite: no    Headache: no    Sleeping disturbance pattern: yes when she takes 2 XR    Irritability: no    Rebound effects (worse than baseline) off medication: no    Anxiousness: no    Dizziness: no    Tics: no   Relevant past medical, surgical, family and social history reviewed and updated as indicated. Interim medical history since our last visit reviewed. Allergies and medications reviewed and updated.  Review of Systems  Constitutional:  Negative for appetite change.  Neurological:  Negative for dizziness.       Denies Tics  Psychiatric/Behavioral:  Positive for decreased concentration. Negative for agitation and sleep disturbance. The patient is not nervous/anxious.    Per HPI unless specifically indicated above     Objective:    BP (!) 148/71   Pulse  83   Temp 98.1 F (36.7 C)   Ht 5' 7.32" (1.71 m)   Wt 116 lb 6 oz (52.8 kg)   LMP 10/25/2011   SpO2 97%   BMI 18.05 kg/m   Wt Readings from Last 3 Encounters:  12/01/20 116 lb 6 oz (52.8 kg)  11/18/20 116 lb (52.6 kg)  09/24/20 114 lb (51.7 kg)    Physical Exam Vitals and nursing note reviewed.  Constitutional:      General: She is not in acute distress.    Appearance: Normal appearance. She is normal weight. She is not ill-appearing, toxic-appearing or diaphoretic.  HENT:     Head: Normocephalic.     Right Ear: External ear normal.     Left Ear: External ear normal.     Nose: Nose normal.     Mouth/Throat:     Mouth: Mucous membranes are moist.     Pharynx: Oropharynx is clear.  Eyes:     General:        Right eye: No discharge.        Left eye: No discharge.     Extraocular Movements: Extraocular movements intact.     Conjunctiva/sclera: Conjunctivae normal.     Pupils: Pupils are equal, round, and reactive to light.  Cardiovascular:     Rate and Rhythm: Normal rate and regular rhythm.     Heart sounds: No murmur heard. Pulmonary:  Effort: Pulmonary effort is normal. No respiratory distress.     Breath sounds: Normal breath sounds. No wheezing or rales.  Musculoskeletal:     Cervical back: Normal range of motion and neck supple.  Skin:    General: Skin is warm and dry.     Capillary Refill: Capillary refill takes less than 2 seconds.  Neurological:     General: No focal deficit present.     Mental Status: She is alert and oriented to person, place, and time. Mental status is at baseline.  Psychiatric:        Mood and Affect: Mood normal.        Behavior: Behavior normal.        Thought Content: Thought content normal.        Judgment: Judgment normal.    Results for orders placed or performed during the hospital encounter of 09/24/20  Surgical pathology  Result Value Ref Range   SURGICAL PATHOLOGY      SURGICAL PATHOLOGY CASE: [id number] PATIENT:  Chelsea Velez Surgical Pathology Report     Specimen Submitted: A. Colon polyp x4, asc; cbx(2) c snare(2) B. Colon polyp x2, sigmoid; cold snare  Clinical History: Screening colonoscopy.  Polyps      DIAGNOSIS: A. COLON POLYP X4, ASCENDING; COLD BIOPSY AND COLD SNARE: - TUBULAR ADENOMA, FOUR FRAGMENTS. - NEGATIVE FOR HIGH-GRADE DYSPLASIA AND MALIGNANCY.  B. COLON POLYP X2, SIGMOID; COLD SNARE: - HYPERPLASTIC POLYP, ONE FRAGMENT. - NEGATIVE FOR DYSPLASIA AND MALIGNANCY.   GROSS DESCRIPTION: A. Labeled: Cold biopsy x2/cold snare x2 ascending colon polyp Received: In formalin Collection time: 2:44 PM on 09/24/2020 Placed into formalin time: 2:44 PM on 09/24/2020 Tissue fragment(s): 4 Size: 1.2 x 0.7 x 0.2 cm Description: Aggregate of tan polypoid fragments Entirely submitted in 1 cassette.  B. Labeled: Cold snare sigmoid colon polyp x2 Received: In formalin Collection time: 2:54 PM on 09/24/2020 Placed i nto formalin time: 2:54 PM on 09/24/2020 Tissue fragment(s): Multiple Size: 0.3 x 0.2 x 0.1 cm Description: Aggregate of 2 tan tissue fragments admixed with a small amount of mucinous material Entirely submitted in 1 cassette.  BD 09/25/20   Final Diagnosis performed by Betsy Pries, MD.   Electronically signed 09/26/2020 9:00:34AM The electronic signature indicates that the named Attending Pathologist has evaluated the specimen Technical component performed at [facility], [address] [place], [place] [address] Lab: [phone] Dir: Rush Farmer, MD, MMM  Professional component performed at [facility], [facility], [place], [place], [place] [address] Lab: [phone] Dir: Dellia Nims. Reuel Derby, MD       Assessment & Plan:   Problem List Items Addressed This Visit       Other   Attention deficit hyperactivity disorder (ADHD), predominantly inattentive type - Primary    Chronic, stable with current regimen.  Continue Adderall XR 30 MG  daily and Dextrostat '20mg'$  daily.  PDMP reviewed today and refills sent through for 3 months.  UDS up to date and controlled substance updated today.  Would benefit from reduction to discontinuation of medication in future.  Return in 3 months.      Relevant Medications   amphetamine-dextroamphetamine (ADDERALL XR) 30 MG 24 hr capsule (Start on 12/08/2020)   Other Relevant Orders   [id number] 11+Oxyco+Alc+Crt-Bund   Controlled substance agreement signed    Controlled substance agreement signed and UDS obtained during visit today.       Relevant Orders   [id number] 11+Oxyco+Alc+Crt-Bund     Follow up plan: Return in  about 3 months (around 03/03/2021) for ADHD FU.

## 2020-12-01 NOTE — Assessment & Plan Note (Signed)
Controlled substance agreement signed and UDS obtained during visit today.  

## 2020-12-01 NOTE — Assessment & Plan Note (Signed)
Chronic, stable with current regimen.  Continue Adderall XR 30 MG daily and Dextrostat '20mg'$  daily.  PDMP reviewed today and refills sent through for 3 months.  UDS up to date and controlled substance updated today.  Would benefit from reduction to discontinuation of medication in future.  Return in 3 months.

## 2020-12-03 DIAGNOSIS — F331 Major depressive disorder, recurrent, moderate: Secondary | ICD-10-CM | POA: Diagnosis not present

## 2020-12-08 LAB — DRUG SCREEN 764883 11+OXYCO+ALC+CRT-BUND
BENZODIAZ UR QL: NEGATIVE ng/mL
Barbiturate: NEGATIVE ng/mL
Cocaine (Metabolite): NEGATIVE ng/mL
Creatinine: 204.7 mg/dL (ref 20.0–300.0)
Ethanol: NEGATIVE %
Meperidine: NEGATIVE ng/mL
Methadone Screen, Urine: NEGATIVE ng/mL
OPIATE SCREEN URINE: NEGATIVE ng/mL
Oxycodone/Oxymorphone, Urine: NEGATIVE ng/mL
Phencyclidine: NEGATIVE ng/mL
Propoxyphene: NEGATIVE ng/mL
Tramadol: NEGATIVE ng/mL
pH, Urine: 5.2 (ref 4.5–8.9)

## 2020-12-08 LAB — CANNABINOID CONFIRMATION, UR
CANNABINOIDS: POSITIVE — AB
Carboxy THC GC/MS Conf: 692 ng/mL

## 2020-12-08 LAB — DRUG PROFILE 799016
Amphetamine GC/MS Conf: >3000 ng/mL
Amphetamine: POSITIVE — AB
Amphetamines: POSITIVE — AB
Methamphetamine: NEGATIVE

## 2020-12-08 NOTE — Progress Notes (Signed)
Hi Chelsea Velez.  Your drug screen looked good.  I do advise you don't use cannibis and adderall together as they do have a drug to drug interaction.  See you at our next visit.

## 2020-12-10 ENCOUNTER — Other Ambulatory Visit: Payer: Self-pay

## 2020-12-10 DIAGNOSIS — F900 Attention-deficit hyperactivity disorder, predominantly inattentive type: Secondary | ICD-10-CM

## 2020-12-11 NOTE — Telephone Encounter (Signed)
Please refuse, already refilled.

## 2020-12-17 DIAGNOSIS — Z01419 Encounter for gynecological examination (general) (routine) without abnormal findings: Secondary | ICD-10-CM | POA: Diagnosis not present

## 2020-12-17 DIAGNOSIS — F331 Major depressive disorder, recurrent, moderate: Secondary | ICD-10-CM | POA: Diagnosis not present

## 2020-12-17 DIAGNOSIS — Z1231 Encounter for screening mammogram for malignant neoplasm of breast: Secondary | ICD-10-CM | POA: Diagnosis not present

## 2020-12-17 DIAGNOSIS — Z681 Body mass index (BMI) 19 or less, adult: Secondary | ICD-10-CM | POA: Diagnosis not present

## 2020-12-24 DIAGNOSIS — S92351D Displaced fracture of fifth metatarsal bone, right foot, subsequent encounter for fracture with routine healing: Secondary | ICD-10-CM | POA: Diagnosis not present

## 2020-12-24 DIAGNOSIS — S92355A Nondisplaced fracture of fifth metatarsal bone, left foot, initial encounter for closed fracture: Secondary | ICD-10-CM | POA: Diagnosis not present

## 2020-12-31 DIAGNOSIS — F331 Major depressive disorder, recurrent, moderate: Secondary | ICD-10-CM | POA: Diagnosis not present

## 2021-01-07 DIAGNOSIS — F331 Major depressive disorder, recurrent, moderate: Secondary | ICD-10-CM | POA: Diagnosis not present

## 2021-01-12 ENCOUNTER — Other Ambulatory Visit: Payer: Self-pay | Admitting: Nurse Practitioner

## 2021-01-12 DIAGNOSIS — F900 Attention-deficit hyperactivity disorder, predominantly inattentive type: Secondary | ICD-10-CM

## 2021-01-13 ENCOUNTER — Other Ambulatory Visit: Payer: Self-pay

## 2021-01-13 DIAGNOSIS — F900 Attention-deficit hyperactivity disorder, predominantly inattentive type: Secondary | ICD-10-CM

## 2021-01-13 MED ORDER — AMPHETAMINE-DEXTROAMPHET ER 30 MG PO CP24: 30.0000 mg | ORAL_CAPSULE | Freq: Every day | ORAL | 0 refills | Status: DC

## 2021-01-13 MED ORDER — DEXTROAMPHETAMINE SULFATE 10 MG PO TABS: 20.0000 mg | ORAL_TABLET | Freq: Every day | ORAL | 0 refills | Status: DC

## 2021-01-13 NOTE — Telephone Encounter (Signed)
Requested medications are due for refill today yes  Requested medications are on the active medication list yes  Last refill 12/12/20  Last visit 09/04/20  Future visit scheduled 03/17/21  Notes to clinic Both of these meds are not delegated, please assess.

## 2021-01-15 DIAGNOSIS — F331 Major depressive disorder, recurrent, moderate: Secondary | ICD-10-CM | POA: Diagnosis not present

## 2021-02-04 DIAGNOSIS — F331 Major depressive disorder, recurrent, moderate: Secondary | ICD-10-CM | POA: Diagnosis not present

## 2021-02-16 ENCOUNTER — Other Ambulatory Visit: Payer: Self-pay

## 2021-02-16 DIAGNOSIS — F900 Attention-deficit hyperactivity disorder, predominantly inattentive type: Secondary | ICD-10-CM

## 2021-02-16 MED ORDER — DEXTROAMPHETAMINE SULFATE 10 MG PO TABS: 20.0000 mg | ORAL_TABLET | Freq: Every day | ORAL | 0 refills | Status: DC

## 2021-02-16 MED ORDER — AMPHETAMINE-DEXTROAMPHET ER 30 MG PO CP24: 30.0000 mg | ORAL_CAPSULE | Freq: Every day | ORAL | 0 refills | Status: DC

## 2021-02-25 DIAGNOSIS — F331 Major depressive disorder, recurrent, moderate: Secondary | ICD-10-CM | POA: Diagnosis not present

## 2021-03-02 NOTE — Progress Notes (Signed)
BP 129/73   Pulse 71   Temp (!) 97.1 F (36.2 C) (Oral)   Wt 114 lb 9.6 oz (52 kg)   LMP 10/25/2011   SpO2 97%   BMI 17.78 kg/m    Subjective:    Patient ID: Chelsea Velez, female    DOB: 1965/11/21, 55 y.o.   MRN: [id number]  HPI: Chelsea Velez is a 55 y.o. female  Chief Complaint  Patient presents with   Anxiety    Pt states she would like to discuss switching to Lexapro   ADHD   ADHD FOLLOW UP ADHD status: stable Satisfied with current therapy: yes would like to discuss coming off medication in the future. Medication compliance:  excellent compliance Controlled substance contract: Updated today Previous psychiatry evaluation:  yes Previous medications: no adderall and adderall XR   Taking meds on weekends/vacations: yes Work/school performance:  good Difficulty sustaining attention/completing tasks: yes Distracted by extraneous stimuli: no Does not listen when spoken to: no  Fidgets with hands or feet: yes Unable to stay in seat: no Blurts out/interrupts others: no ADHD Medication Side Effects: yes    Decreased appetite: no    Headache: no    Sleeping disturbance pattern: yes when she takes 2 XR    Irritability: no    Rebound effects (worse than baseline) off medication: no    Anxiousness: no    Dizziness: no    Tics: no  DEPRESSION Patient states her depression has worsened.  She feels like there are days when she can't get out of bed.  She is crying a lot.  Feels like it has worsened since menopause.  Her job is very stressful.  Denies SI.  [place] Office Visit from 03/03/2021 in [facility]  PHQ-9 Total Score 18      GAD 7 : Generalized Anxiety Score 03/03/2021 01/07/2020 11/05/2019 09/17/2019  Nervous, Anxious, on Edge 1 0 0 2  Control/stop worrying 0 0 1 0  Worry too much - different things 1 0 0 0  Trouble relaxing 0 0 1 2  Restless 0 3 1 3   Easily annoyed or irritable 1 0 1 1  Afraid - awful might happen 0 0 1 1  Total GAD  7 Score 3 3 5 9   Anxiety Difficulty Somewhat difficult Not difficult at all Somewhat difficult Somewhat difficult       Relevant past medical, surgical, family and social history reviewed and updated as indicated. Interim medical history since our last visit reviewed. Allergies and medications reviewed and updated.  Review of Systems  Constitutional:  Negative for appetite change.  Neurological:  Negative for dizziness.       Denies Tics  Psychiatric/Behavioral:  Positive for decreased concentration and dysphoric mood. Negative for agitation and sleep disturbance. The patient is not nervous/anxious.    Per HPI unless specifically indicated above     Objective:    BP 129/73   Pulse 71   Temp (!) 97.1 F (36.2 C) (Oral)   Wt 114 lb 9.6 oz (52 kg)   LMP 10/25/2011   SpO2 97%   BMI 17.78 kg/m   Wt Readings from Last 3 Encounters:  03/03/21 114 lb 9.6 oz (52 kg)  12/01/20 116 lb 6 oz (52.8 kg)  11/18/20 116 lb (52.6 kg)    Physical Exam Vitals and nursing note reviewed.  Constitutional:      General: She is not in acute distress.    Appearance: Normal appearance. She is  normal weight. She is not ill-appearing, toxic-appearing or diaphoretic.  HENT:     Head: Normocephalic.     Right Ear: External ear normal.     Left Ear: External ear normal.     Nose: Nose normal.     Mouth/Throat:     Mouth: Mucous membranes are moist.     Pharynx: Oropharynx is clear.  Eyes:     General:        Right eye: No discharge.        Left eye: No discharge.     Extraocular Movements: Extraocular movements intact.     Conjunctiva/sclera: Conjunctivae normal.     Pupils: Pupils are equal, round, and reactive to light.  Cardiovascular:     Rate and Rhythm: Normal rate and regular rhythm.     Heart sounds: No murmur heard. Pulmonary:     Effort: Pulmonary effort is normal. No respiratory distress.     Breath sounds: Normal breath sounds. No wheezing or rales.  Musculoskeletal:      Cervical back: Normal range of motion and neck supple.  Skin:    General: Skin is warm and dry.     Capillary Refill: Capillary refill takes less than 2 seconds.  Neurological:     General: No focal deficit present.     Mental Status: She is alert and oriented to person, place, and time. Mental status is at baseline.  Psychiatric:        Mood and Affect: Mood normal.        Behavior: Behavior normal.        Thought Content: Thought content normal.        Judgment: Judgment normal.    Results for orders placed or performed in visit on 12/01/20  [id number] 11+Oxyco+Alc+Crt-Bund  Result Value Ref Range   Ethanol Negative Cutoff=0.020 %   Amphetamines, Urine See Final Results Cutoff=1000 ng/mL   Barbiturate Negative Cutoff=200 ng/mL   BENZODIAZ UR QL Negative Cutoff=200 ng/mL   Cannabinoid Quant, Ur See Final Results Cutoff=50 ng/mL   Cocaine (Metabolite) Negative Cutoff=300 ng/mL   OPIATE SCREEN URINE Negative Cutoff=300 ng/mL   Oxycodone/Oxymorphone, Urine Negative Cutoff=300 ng/mL   Phencyclidine Negative Cutoff=25 ng/mL   Methadone Screen, Urine Negative Cutoff=300 ng/mL   Propoxyphene Negative Cutoff=300 ng/mL   Meperidine Negative Cutoff=200 ng/mL   Tramadol Negative Cutoff=200 ng/mL   Creatinine 204.7 20.0 - 300.0 mg/dL   pH, Urine 5.2 4.5 - 8.9  Drug Profile [phone]  Result Value Ref Range   Amphetamines Positive (A) Cutoff=1000   Amphetamine Positive (A)    Amphetamine GC/MS Conf >3000 Cutoff=500 ng/mL   Methamphetamine Negative Cutoff=500  Cannabinoid Conf, Ur  Result Value Ref Range   CANNABINOIDS Positive (A) Cutoff=50   Carboxy THC GC/MS Conf 692 Cutoff=15 ng/mL      Assessment & Plan:   Problem List Items Addressed This Visit       Other   Attention deficit hyperactivity disorder (ADHD), predominantly inattentive type - Primary    Chronic, stable with current regimen.  Continue Adderall XR 30 MG daily and Dextrostat 20mg  daily.  PDMP reviewed today and refills  sent through for 3 months.  UDS up to date and controlled substance updated today.  Return in 3 months.      Depression, recurrent (HCC)    Chronic. Not well controlled.  Will change Zoloft to Effexor 37.5mg  daily.  Side effects and benefits of medication discussed with patient during visit.  Follow up in 1 month for  reevaluation.       Relevant Medications   venlafaxine XR (EFFEXOR XR) 37.5 MG 24 hr capsule     Follow up plan: Return in about 1 month (around 04/02/2021) for Depression/Anxiety FU.

## 2021-03-03 ENCOUNTER — Encounter: Payer: Self-pay | Admitting: Nurse Practitioner

## 2021-03-03 ENCOUNTER — Other Ambulatory Visit: Payer: Self-pay

## 2021-03-03 ENCOUNTER — Ambulatory Visit: Payer: BC Managed Care – PPO | Admitting: Nurse Practitioner

## 2021-03-03 VITALS — BP 129/73 | HR 71 | Temp 97.1°F | Wt 114.6 lb

## 2021-03-03 DIAGNOSIS — F900 Attention-deficit hyperactivity disorder, predominantly inattentive type: Secondary | ICD-10-CM | POA: Diagnosis not present

## 2021-03-03 DIAGNOSIS — F339 Major depressive disorder, recurrent, unspecified: Secondary | ICD-10-CM | POA: Diagnosis not present

## 2021-03-03 MED ORDER — VENLAFAXINE HCL ER 37.5 MG PO CP24: 37.5000 mg | ORAL_CAPSULE | Freq: Every day | ORAL | 0 refills | Status: DC

## 2021-03-04 MED ORDER — AMPHETAMINE-DEXTROAMPHET ER 30 MG PO CP24: 30.0000 mg | ORAL_CAPSULE | Freq: Every day | ORAL | 0 refills | Status: DC

## 2021-03-04 MED ORDER — DEXTROAMPHETAMINE SULFATE 10 MG PO TABS: 10.0000 mg | ORAL_TABLET | Freq: Every day | ORAL | 0 refills | Status: DC

## 2021-03-04 MED ORDER — DEXTROAMPHETAMINE SULFATE 10 MG PO TABS: 20.0000 mg | ORAL_TABLET | Freq: Every day | ORAL | 0 refills | Status: DC

## 2021-03-04 NOTE — Assessment & Plan Note (Signed)
Chronic. Not well controlled.  Will change Zoloft to Effexor 37.5mg  daily.  Side effects and benefits of medication discussed with patient during visit.  Follow up in 1 month for reevaluation.

## 2021-03-04 NOTE — Assessment & Plan Note (Signed)
Chronic, stable with current regimen.  Continue Adderall XR 30 MG daily and Dextrostat 20mg  daily.  PDMP reviewed today and refills sent through for 3 months.  UDS up to date and controlled substance updated today.  Return in 3 months.

## 2021-03-20 ENCOUNTER — Other Ambulatory Visit: Payer: Self-pay | Admitting: Nurse Practitioner

## 2021-03-20 DIAGNOSIS — F900 Attention-deficit hyperactivity disorder, predominantly inattentive type: Secondary | ICD-10-CM

## 2021-03-23 ENCOUNTER — Encounter: Payer: Self-pay | Admitting: Nurse Practitioner

## 2021-03-23 ENCOUNTER — Other Ambulatory Visit: Payer: Self-pay | Admitting: Nurse Practitioner

## 2021-03-23 NOTE — Telephone Encounter (Signed)
Requested medications are due for refill today.  unsure  Requested medications are on the active medications list.  yes  Last refill. 03/03/2021  Future visit scheduled.   yes  Notes to clinic.  Please see notes from 03/23/2021 regarding  side effects from this medication.  Also labs are expired.    Requested Prescriptions  Pending Prescriptions Disp Refills   venlafaxine XR (EFFEXOR-XR) 37.5 MG 24 hr capsule [Pharmacy Med Name: VENLAFAXINE HCL ER 37.5 MG CAP] 30 capsule 0    Sig: Take 1 capsule (37.5 mg total) by mouth daily with breakfast.     Psychiatry: Antidepressants - SNRI - desvenlafaxine & venlafaxine Failed - 03/23/2021  4:37 PM      Failed - LDL in normal range and within 360 days    LDL Chol Calc (NIH)  Date Value Ref Range Status  11/05/2019 123 (H) 0 - 99 mg/dL Final          Failed - Total Cholesterol in normal range and within 360 days    Cholesterol, Total  Date Value Ref Range Status  11/05/2019 195 100 - 199 mg/dL Final          Failed - Triglycerides in normal range and within 360 days    Triglycerides  Date Value Ref Range Status  11/05/2019 77 0 - 149 mg/dL Final          Passed - Completed PHQ-2 or PHQ-9 in the last 360 days      Passed - Last BP in normal range    BP Readings from Last 1 Encounters:  03/03/21 129/73          Passed - Valid encounter within last 6 months    Recent Outpatient Visits           2 weeks ago Attention deficit hyperactivity disorder (ADHD), predominantly inattentive type   [facility] Jon Billings, NP   3 months ago Attention deficit hyperactivity disorder (ADHD), predominantly inattentive type   [facility] Jon Billings, NP   6 months ago Attention deficit hyperactivity disorder (ADHD), predominantly inattentive type   [facility] Jon Billings, NP   11 months ago Attention deficit hyperactivity disorder (ADHD), predominantly inattentive type   [place], Henrine Screws T, NP   1 year ago Attention deficit hyperactivity disorder (ADHD), predominantly inattentive type   [facility] Eulogio Bear, NP       Future Appointments             In 1 week Jon Billings, NP [facility]

## 2021-03-25 DIAGNOSIS — F331 Major depressive disorder, recurrent, moderate: Secondary | ICD-10-CM | POA: Diagnosis not present

## 2021-04-01 NOTE — Progress Notes (Signed)
BP 117/68    Pulse 70    Temp 97.8 F (36.6 C) (Oral)    Wt 117 lb 6.4 oz (53.3 kg)    LMP 10/25/2011    SpO2 98%    BMI 18.21 kg/m    Subjective:    Patient ID: Chelsea Velez, female    DOB: Jul 27, 1965, 55 y.o.   MRN: [id number]  HPI: Chelsea Velez is a 55 y.o. female  Chief Complaint  Patient presents with   Depression    Pt states she stopped the Effexor. States it was making her heart race and gave her a headache. States she took it for about 3 weeks total.     DEPRESSION Patient states she was not able to tolerate the Effexor.  She stopped taking it after about 3 weeks then started back on her old sertraline until she could come in for a visit. She feels like her depression is taking control of her life.  Denies SI.  [place] Office Visit from 04/02/2021 in [place]  PHQ-9 Total Score 16      GAD 7 : Generalized Anxiety Score 04/02/2021 03/03/2021 01/07/2020 11/05/2019  Nervous, Anxious, on Edge 1 1 0 0  Control/stop worrying 1 0 0 1  Worry too much - different things 0 1 0 0  Trouble relaxing 1 0 0 1  Restless 2 0 3 1  Easily annoyed or irritable 2 1 0 1  Afraid - awful might happen 0 0 0 1  Total GAD 7 Score 7 3 3 5   Anxiety Difficulty Very difficult Somewhat difficult Not difficult at all Somewhat difficult       Relevant past medical, surgical, family and social history reviewed and updated as indicated. Interim medical history since our last visit reviewed. Allergies and medications reviewed and updated.  Review of Systems  Constitutional:  Negative for appetite change.  Neurological:  Negative for dizziness.       Denies Tics  Psychiatric/Behavioral:  Positive for decreased concentration and dysphoric mood. Negative for agitation and sleep disturbance. The patient is not nervous/anxious.    Per HPI unless specifically indicated above     Objective:    BP 117/68    Pulse 70    Temp 97.8 F (36.6 C) (Oral)    Wt 117 lb 6.4 oz  (53.3 kg)    LMP 10/25/2011    SpO2 98%    BMI 18.21 kg/m   Wt Readings from Last 3 Encounters:  04/02/21 117 lb 6.4 oz (53.3 kg)  03/03/21 114 lb 9.6 oz (52 kg)  12/01/20 116 lb 6 oz (52.8 kg)    Physical Exam Vitals and nursing note reviewed.  Constitutional:      General: She is not in acute distress.    Appearance: Normal appearance. She is normal weight. She is not ill-appearing, toxic-appearing or diaphoretic.  HENT:     Head: Normocephalic.     Right Ear: External ear normal.     Left Ear: External ear normal.     Nose: Nose normal.     Mouth/Throat:     Mouth: Mucous membranes are moist.     Pharynx: Oropharynx is clear.  Eyes:     General:        Right eye: No discharge.        Left eye: No discharge.     Extraocular Movements: Extraocular movements intact.     Conjunctiva/sclera: Conjunctivae normal.     Pupils: Pupils  are equal, round, and reactive to light.  Cardiovascular:     Rate and Rhythm: Normal rate and regular rhythm.     Heart sounds: No murmur heard. Pulmonary:     Effort: Pulmonary effort is normal. No respiratory distress.     Breath sounds: Normal breath sounds. No wheezing or rales.  Musculoskeletal:     Cervical back: Normal range of motion and neck supple.  Skin:    General: Skin is warm and dry.     Capillary Refill: Capillary refill takes less than 2 seconds.  Neurological:     General: No focal deficit present.     Mental Status: She is alert and oriented to person, place, and time. Mental status is at baseline.  Psychiatric:        Mood and Affect: Mood normal.        Behavior: Behavior normal.        Thought Content: Thought content normal.        Judgment: Judgment normal.    Results for orders placed or performed in visit on 12/01/20  [id number] 11+Oxyco+Alc+Crt-Bund  Result Value Ref Range   Ethanol Negative Cutoff=0.020 %   Amphetamines, Urine See Final Results Cutoff=1000 ng/mL   Barbiturate Negative Cutoff=200 ng/mL   BENZODIAZ  UR QL Negative Cutoff=200 ng/mL   Cannabinoid Quant, Ur See Final Results Cutoff=50 ng/mL   Cocaine (Metabolite) Negative Cutoff=300 ng/mL   OPIATE SCREEN URINE Negative Cutoff=300 ng/mL   Oxycodone/Oxymorphone, Urine Negative Cutoff=300 ng/mL   Phencyclidine Negative Cutoff=25 ng/mL   Methadone Screen, Urine Negative Cutoff=300 ng/mL   Propoxyphene Negative Cutoff=300 ng/mL   Meperidine Negative Cutoff=200 ng/mL   Tramadol Negative Cutoff=200 ng/mL   Creatinine 204.7 20.0 - 300.0 mg/dL   pH, Urine 5.2 4.5 - 8.9  Drug Profile [phone]  Result Value Ref Range   Amphetamines Positive (A) Cutoff=1000   Amphetamine Positive (A)    Amphetamine GC/MS Conf >3000 Cutoff=500 ng/mL   Methamphetamine Negative Cutoff=500  Cannabinoid Conf, Ur  Result Value Ref Range   CANNABINOIDS Positive (A) Cutoff=50   Carboxy THC GC/MS Conf 692 Cutoff=15 ng/mL      Assessment & Plan:   Problem List Items Addressed This Visit       Other   Depression, recurrent ([place]) - Primary    Chronic. Not well controlled. Will change Effexor to Lexapro. Side effects and benefits of medication discussed with patient during visit. Follow up in 2 weeks for reevaluation.       Relevant Medications   escitalopram (LEXAPRO) 5 MG tablet     Follow up plan: Return in about 2 months (around 06/03/2021) for Depression/Anxiety FU.

## 2021-04-02 ENCOUNTER — Ambulatory Visit: Payer: BC Managed Care – PPO | Admitting: Nurse Practitioner

## 2021-04-02 ENCOUNTER — Encounter: Payer: Self-pay | Admitting: Nurse Practitioner

## 2021-04-02 ENCOUNTER — Other Ambulatory Visit: Payer: Self-pay

## 2021-04-02 VITALS — BP 117/68 | HR 70 | Temp 97.8°F | Wt 117.4 lb

## 2021-04-02 DIAGNOSIS — F339 Major depressive disorder, recurrent, unspecified: Secondary | ICD-10-CM

## 2021-04-02 MED ORDER — ESCITALOPRAM OXALATE 5 MG PO TABS: 5.0000 mg | ORAL_TABLET | Freq: Every day | ORAL | 0 refills | Status: DC

## 2021-04-02 NOTE — Assessment & Plan Note (Signed)
Chronic. Not well controlled. Will change Effexor to Lexapro. Side effects and benefits of medication discussed with patient during visit. Follow up in 2 weeks for reevaluation.

## 2021-04-16 ENCOUNTER — Ambulatory Visit: Payer: BC Managed Care – PPO | Admitting: Nurse Practitioner

## 2021-04-22 ENCOUNTER — Ambulatory Visit: Payer: BC Managed Care – PPO | Admitting: Nurse Practitioner

## 2021-04-22 ENCOUNTER — Encounter: Payer: Self-pay | Admitting: Nurse Practitioner

## 2021-04-22 ENCOUNTER — Other Ambulatory Visit: Payer: Self-pay

## 2021-04-22 VITALS — BP 125/71 | HR 78 | Temp 98.4°F | Ht 67.32 in | Wt 115.4 lb

## 2021-04-22 DIAGNOSIS — F339 Major depressive disorder, recurrent, unspecified: Secondary | ICD-10-CM | POA: Diagnosis not present

## 2021-04-22 DIAGNOSIS — F900 Attention-deficit hyperactivity disorder, predominantly inattentive type: Secondary | ICD-10-CM | POA: Diagnosis not present

## 2021-04-22 DIAGNOSIS — Z0289 Encounter for other administrative examinations: Secondary | ICD-10-CM

## 2021-04-22 MED ORDER — AMPHETAMINE-DEXTROAMPHET ER 20 MG PO CP24: 20.0000 mg | ORAL_CAPSULE | Freq: Every day | ORAL | 0 refills | Status: DC

## 2021-04-22 NOTE — Assessment & Plan Note (Addendum)
Chronic.  Not well controlled.  Improved with Lexapro.  Continue with 5mg  of Lexapro for antoher week then increase to Lexapro 10mg  daily in 1 week.  Follow up in 2 weeks for reevaluation. Call sooner if concerns arise.

## 2021-04-22 NOTE — Progress Notes (Signed)
BP 125/71    Pulse 78    Temp 98.4 F (36.9 C) (Oral)    Ht 5' 7.32" (1.71 m)    Wt 115 lb 6.4 oz (52.3 kg)    LMP 10/25/2011    SpO2 99%    BMI 17.90 kg/m    Subjective:    Patient ID: Chelsea Velez, female    DOB: 1965-07-16, 56 y.o.   MRN: [id number]  HPI: Chelsea Velez is a 56 y.o. female  Chief Complaint  Patient presents with   Depression   ADHD    DEPRESSION Patient states she it was difficult because of the Holidays.  She feels very embarrassed and feels like something is wrong with her.  She is very angry with herself.  She states she started the Lexapro but accidentally started taking the Sertraline and felt like she went backwards.  However, when she was taking the Lexapro she felt like her symptoms had improved.  She restarted the medication last week. She has FMLA paperwork that needs to be filled out.    [place] Office Visit from 04/22/2021 in [place]  PHQ-9 Total Score 20      GAD 7 : Generalized Anxiety Score 04/22/2021 04/02/2021 03/03/2021 01/07/2020  Nervous, Anxious, on Edge 1 1 1  0  Control/stop worrying 1 1 0 0  Worry too much - different things 1 0 1 0  Trouble relaxing 2 1 0 0  Restless 2 2 0 3  Easily annoyed or irritable 2 2 1  0  Afraid - awful might happen 1 0 0 0  Total GAD 7 Score 10 7 3 3   Anxiety Difficulty - Very difficult Somewhat difficult Not difficult at all    ADHD Patient states she would like to decrease her XR Adderall dose to see if it helps her feel better. She feels like she still needs it but would like to decrease her dose.   Relevant past medical, surgical, family and social history reviewed and updated as indicated. Interim medical history since our last visit reviewed. Allergies and medications reviewed and updated.  Review of Systems  Constitutional:  Negative for appetite change.  Neurological:  Negative for dizziness.       Denies Tics  Psychiatric/Behavioral:  Positive for decreased  concentration and dysphoric mood. Negative for agitation and sleep disturbance. The patient is not nervous/anxious.    Per HPI unless specifically indicated above     Objective:    BP 125/71    Pulse 78    Temp 98.4 F (36.9 C) (Oral)    Ht 5' 7.32" (1.71 m)    Wt 115 lb 6.4 oz (52.3 kg)    LMP 10/25/2011    SpO2 99%    BMI 17.90 kg/m   Wt Readings from Last 3 Encounters:  04/22/21 115 lb 6.4 oz (52.3 kg)  04/02/21 117 lb 6.4 oz (53.3 kg)  03/03/21 114 lb 9.6 oz (52 kg)    Physical Exam Vitals and nursing note reviewed.  Constitutional:      General: She is not in acute distress.    Appearance: Normal appearance. She is normal weight. She is not ill-appearing, toxic-appearing or diaphoretic.  HENT:     Head: Normocephalic.     Right Ear: External ear normal.     Left Ear: External ear normal.     Nose: Nose normal.     Mouth/Throat:     Mouth: Mucous membranes are moist.  Pharynx: Oropharynx is clear.  Eyes:     General:        Right eye: No discharge.        Left eye: No discharge.     Extraocular Movements: Extraocular movements intact.     Conjunctiva/sclera: Conjunctivae normal.     Pupils: Pupils are equal, round, and reactive to light.  Cardiovascular:     Rate and Rhythm: Normal rate and regular rhythm.     Heart sounds: No murmur heard. Pulmonary:     Effort: Pulmonary effort is normal. No respiratory distress.     Breath sounds: Normal breath sounds. No wheezing or rales.  Musculoskeletal:     Cervical back: Normal range of motion and neck supple.  Skin:    General: Skin is warm and dry.     Capillary Refill: Capillary refill takes less than 2 seconds.  Neurological:     General: No focal deficit present.     Mental Status: She is alert and oriented to person, place, and time. Mental status is at baseline.  Psychiatric:        Mood and Affect: Mood normal.        Behavior: Behavior normal.        Thought Content: Thought content normal.         Judgment: Judgment normal.    Results for orders placed or performed in visit on 12/01/20  [id number] 11+Oxyco+Alc+Crt-Bund  Result Value Ref Range   Ethanol Negative Cutoff=0.020 %   Amphetamines, Urine See Final Results Cutoff=1000 ng/mL   Barbiturate Negative Cutoff=200 ng/mL   BENZODIAZ UR QL Negative Cutoff=200 ng/mL   Cannabinoid Quant, Ur See Final Results Cutoff=50 ng/mL   Cocaine (Metabolite) Negative Cutoff=300 ng/mL   OPIATE SCREEN URINE Negative Cutoff=300 ng/mL   Oxycodone/Oxymorphone, Urine Negative Cutoff=300 ng/mL   Phencyclidine Negative Cutoff=25 ng/mL   Methadone Screen, Urine Negative Cutoff=300 ng/mL   Propoxyphene Negative Cutoff=300 ng/mL   Meperidine Negative Cutoff=200 ng/mL   Tramadol Negative Cutoff=200 ng/mL   Creatinine 204.7 20.0 - 300.0 mg/dL   pH, Urine 5.2 4.5 - 8.9  Drug Profile [phone]  Result Value Ref Range   Amphetamines Positive (A) Cutoff=1000   Amphetamine Positive (A)    Amphetamine GC/MS Conf >3000 Cutoff=500 ng/mL   Methamphetamine Negative Cutoff=500  Cannabinoid Conf, Ur  Result Value Ref Range   CANNABINOIDS Positive (A) Cutoff=50   Carboxy THC GC/MS Conf 692 Cutoff=15 ng/mL      Assessment & Plan:   Problem List Items Addressed This Visit       Other   Attention deficit hyperactivity disorder (ADHD), predominantly inattentive type    Chronic. Patient would like to decrease her XR dose to Adderrall 20mg .  Medication sent in to the pharmacy. Follow up in 2 weeks for reevaluation.      Relevant Medications   amphetamine-dextroamphetamine (ADDERALL XR) 20 MG 24 hr capsule   Depression, recurrent (HCC) - Primary    Chronic.  Not well controlled.  Improved with Lexapro.  Continue with 5mg  of Lexapro for antoher week then increase to Lexapro 10mg  daily in 1 week.  Follow up in 2 weeks for reevaluation. Call sooner if concerns arise.        Other Visit Diagnoses     Encounter for completion of form with patient       Patient is  requesting FMLA paperwork.  Will fill out and send to employer.        Follow up plan: Return in about  2 weeks (around 05/06/2021) for Depression/Anxiety FU.

## 2021-04-22 NOTE — Assessment & Plan Note (Signed)
Chronic. Patient would like to decrease her XR dose to Adderrall 20mg .  Medication sent in to the pharmacy. Follow up in 2 weeks for reevaluation.

## 2021-04-23 ENCOUNTER — Other Ambulatory Visit: Payer: Self-pay | Admitting: Nurse Practitioner

## 2021-04-23 ENCOUNTER — Encounter: Payer: Self-pay | Admitting: Nurse Practitioner

## 2021-04-23 DIAGNOSIS — F900 Attention-deficit hyperactivity disorder, predominantly inattentive type: Secondary | ICD-10-CM

## 2021-04-23 MED ORDER — DEXTROAMPHETAMINE SULFATE 10 MG PO TABS: 20.0000 mg | ORAL_TABLET | Freq: Every day | ORAL | 0 refills | Status: DC

## 2021-04-23 NOTE — Telephone Encounter (Signed)
Please refuse, RX should already be at the pharmacy.

## 2021-05-06 ENCOUNTER — Other Ambulatory Visit: Payer: Self-pay

## 2021-05-06 ENCOUNTER — Ambulatory Visit: Payer: BC Managed Care – PPO | Admitting: Nurse Practitioner

## 2021-05-06 ENCOUNTER — Encounter: Payer: Self-pay | Admitting: Nurse Practitioner

## 2021-05-06 VITALS — BP 89/58 | HR 74 | Temp 98.3°F | Wt 115.8 lb

## 2021-05-06 DIAGNOSIS — F900 Attention-deficit hyperactivity disorder, predominantly inattentive type: Secondary | ICD-10-CM | POA: Diagnosis not present

## 2021-05-06 DIAGNOSIS — F339 Major depressive disorder, recurrent, unspecified: Secondary | ICD-10-CM | POA: Diagnosis not present

## 2021-05-06 MED ORDER — ESCITALOPRAM OXALATE 10 MG PO TABS: 10.0000 mg | ORAL_TABLET | Freq: Every day | ORAL | 0 refills | Status: DC

## 2021-05-06 NOTE — Progress Notes (Signed)
BP (!) 89/58    Pulse 74    Temp 98.3 F (36.8 C) (Oral)    Wt 115 lb 12.8 oz (52.5 kg)    LMP 10/25/2011    SpO2 99%    BMI 17.96 kg/m    Subjective:    Patient ID: Chelsea Velez, female    DOB: 02/14/1966, 56 y.o.   MRN: [id number]  HPI: Chelsea Velez is a 56 y.o. female  Chief Complaint  Patient presents with   Depression    DEPRESSION Patient states she is getting better.  Patient states she is feeling a little better.  She does feel like the decrease in the adderall dose has helped her symptoms.  She feels less tense.  She would like to increase the dose of the Lexapro.    [place] Office Visit from 05/06/2021 in [facility]  PHQ-9 Total Score 17      GAD 7 : Generalized Anxiety Score 05/06/2021 04/22/2021 04/02/2021 03/03/2021  Nervous, Anxious, on Edge 1 1 1 1   Control/stop worrying 1 1 1  0  Worry too much - different things 1 1 0 1  Trouble relaxing 1 2 1  0  Restless 1 2 2  0  Easily annoyed or irritable 3 2 2 1   Afraid - awful might happen 1 1 0 0  Total GAD 7 Score 9 10 7 3   Anxiety Difficulty Somewhat difficult - Very difficult Somewhat difficult    ADHD Patient states she would like to decrease her XR Adderall dose to see if it helps her feel better. She feels like she still needs it but would like to decrease her dose.   Relevant past medical, surgical, family and social history reviewed and updated as indicated. Interim medical history since our last visit reviewed. Allergies and medications reviewed and updated.  Review of Systems  Constitutional:  Negative for appetite change.  Neurological:  Negative for dizziness.       Denies Tics  Psychiatric/Behavioral:  Positive for decreased concentration and dysphoric mood. Negative for agitation and sleep disturbance. The patient is not nervous/anxious.    Per HPI unless specifically indicated above     Objective:    BP (!) 89/58    Pulse 74    Temp 98.3 F (36.8 C) (Oral)    Wt 115  lb 12.8 oz (52.5 kg)    LMP 10/25/2011    SpO2 99%    BMI 17.96 kg/m   Wt Readings from Last 3 Encounters:  05/06/21 115 lb 12.8 oz (52.5 kg)  04/22/21 115 lb 6.4 oz (52.3 kg)  04/02/21 117 lb 6.4 oz (53.3 kg)    Physical Exam Vitals and nursing note reviewed.  Constitutional:      General: She is not in acute distress.    Appearance: Normal appearance. She is normal weight. She is not ill-appearing, toxic-appearing or diaphoretic.  HENT:     Head: Normocephalic.     Right Ear: External ear normal.     Left Ear: External ear normal.     Nose: Nose normal.     Mouth/Throat:     Mouth: Mucous membranes are moist.     Pharynx: Oropharynx is clear.  Eyes:     General:        Right eye: No discharge.        Left eye: No discharge.     Extraocular Movements: Extraocular movements intact.     Conjunctiva/sclera: Conjunctivae normal.     Pupils:  Pupils are equal, round, and reactive to light.  Cardiovascular:     Rate and Rhythm: Normal rate and regular rhythm.     Heart sounds: No murmur heard. Pulmonary:     Effort: Pulmonary effort is normal. No respiratory distress.     Breath sounds: Normal breath sounds. No wheezing or rales.  Musculoskeletal:     Cervical back: Normal range of motion and neck supple.  Skin:    General: Skin is warm and dry.     Capillary Refill: Capillary refill takes less than 2 seconds.  Neurological:     General: No focal deficit present.     Mental Status: She is alert and oriented to person, place, and time. Mental status is at baseline.  Psychiatric:        Mood and Affect: Mood normal.        Behavior: Behavior normal.        Thought Content: Thought content normal.        Judgment: Judgment normal.    Results for orders placed or performed in visit on 12/01/20  [id number] 11+Oxyco+Alc+Crt-Bund  Result Value Ref Range   Ethanol Negative Cutoff=0.020 %   Amphetamines, Urine See Final Results Cutoff=1000 ng/mL   Barbiturate Negative Cutoff=200  ng/mL   BENZODIAZ UR QL Negative Cutoff=200 ng/mL   Cannabinoid Quant, Ur See Final Results Cutoff=50 ng/mL   Cocaine (Metabolite) Negative Cutoff=300 ng/mL   OPIATE SCREEN URINE Negative Cutoff=300 ng/mL   Oxycodone/Oxymorphone, Urine Negative Cutoff=300 ng/mL   Phencyclidine Negative Cutoff=25 ng/mL   Methadone Screen, Urine Negative Cutoff=300 ng/mL   Propoxyphene Negative Cutoff=300 ng/mL   Meperidine Negative Cutoff=200 ng/mL   Tramadol Negative Cutoff=200 ng/mL   Creatinine 204.7 20.0 - 300.0 mg/dL   pH, Urine 5.2 4.5 - 8.9  Drug Profile [phone]  Result Value Ref Range   Amphetamines Positive (A) Cutoff=1000   Amphetamine Positive (A)    Amphetamine GC/MS Conf >3000 Cutoff=500 ng/mL   Methamphetamine Negative Cutoff=500  Cannabinoid Conf, Ur  Result Value Ref Range   CANNABINOIDS Positive (A) Cutoff=50   Carboxy THC GC/MS Conf 692 Cutoff=15 ng/mL      Assessment & Plan:   Problem List Items Addressed This Visit   None    Follow up plan: No follow-ups on file.

## 2021-05-06 NOTE — Assessment & Plan Note (Signed)
Ongoing. Will continue with Adderall 20mg  daily. Feels like it has helped her feel less tense.  She would like to continue this dose until her depression is better controlled. At that point, she may want to decrease to Adderall 10mg . Will continue to reassess at next visit.

## 2021-05-06 NOTE — Assessment & Plan Note (Addendum)
Chronic.  Uncontrolled but slightly improved with the Lexapro 5mg .  Will increase the Lexapro to 10mg  daily.  Follow up in 10 days due to returning to work on February 6.  Will make sure patient is ready to return.  She is aware that her employment is adding to the stress she is experiencing.  Call sooner if concerns arise.

## 2021-05-07 DIAGNOSIS — F331 Major depressive disorder, recurrent, moderate: Secondary | ICD-10-CM | POA: Diagnosis not present

## 2021-05-15 ENCOUNTER — Encounter: Payer: Self-pay | Admitting: Nurse Practitioner

## 2021-05-15 ENCOUNTER — Ambulatory Visit: Payer: BC Managed Care – PPO | Admitting: Nurse Practitioner

## 2021-05-15 ENCOUNTER — Other Ambulatory Visit: Payer: Self-pay

## 2021-05-15 VITALS — BP 100/50 | HR 73 | Temp 98.3°F

## 2021-05-15 DIAGNOSIS — F339 Major depressive disorder, recurrent, unspecified: Secondary | ICD-10-CM | POA: Diagnosis not present

## 2021-05-15 NOTE — Assessment & Plan Note (Signed)
Chronic. Improving. Will increase Lexapro to 20mg .  Will extend FMLA paperwork for a return to work date of 06/04/2021.  Follow up in 1 week for reevaluation.

## 2021-05-15 NOTE — Progress Notes (Signed)
BP (!) 100/50    Pulse 73    Temp 98.3 F (36.8 C) (Oral)    LMP 10/25/2011    SpO2 99%    Subjective:    Patient ID: Chelsea Velez, female    DOB: [birth year], 56 y.o.   MRN: [id number]  HPI: Chelsea Velez is a 56 y.o. female  Chief Complaint  Patient presents with   Depression   Toe Injury    Pt states she has been having issues with her toes where she feels like her toes are bending under her feet. States she feels like she keeps "catching" them.     DEPRESSION Patient states she is getting better.  She feels like she has improved but is nervous about going back to work due to not feeling great.  She understands that she will need to go back and that will be a source of stress.     [place] Office Visit from 05/15/2021 in [facility]  PHQ-9 Total Score 18      GAD 7 : Generalized Anxiety Score 05/15/2021 05/06/2021 04/22/2021 04/02/2021  Nervous, Anxious, on Edge 1 1 1 1   Control/stop worrying 0 1 1 1   Worry too much - different things 0 1 1 0  Trouble relaxing 1 1 2 1   Restless 1 1 2 2   Easily annoyed or irritable 0 3 2 2   Afraid - awful might happen 0 1 1 0  Total GAD 7 Score 3 9 10 7   Anxiety Difficulty Somewhat difficult Somewhat difficult - Very difficult     Relevant past medical, surgical, family and social history reviewed and updated as indicated. Interim medical history since our last visit reviewed. Allergies and medications reviewed and updated.  Review of Systems  Psychiatric/Behavioral:  Positive for dysphoric mood. The patient is nervous/anxious.    Per HPI unless specifically indicated above     Objective:    BP (!) 100/50    Pulse 73    Temp 98.3 F (36.8 C) (Oral)    LMP 10/25/2011    SpO2 99%   Wt Readings from Last 3 Encounters:  05/06/21 115 lb 12.8 oz (52.5 kg)  04/22/21 115 lb 6.4 oz (52.3 kg)  04/02/21 117 lb 6.4 oz (53.3 kg)    Physical Exam Vitals and nursing note reviewed.  Constitutional:      General: She is  not in acute distress.    Appearance: Normal appearance. She is normal weight. She is not ill-appearing, toxic-appearing or diaphoretic.  HENT:     Head: Normocephalic.     Right Ear: External ear normal.     Left Ear: External ear normal.     Nose: Nose normal.     Mouth/Throat:     Mouth: Mucous membranes are moist.     Pharynx: Oropharynx is clear.  Eyes:     General:        Right eye: No discharge.        Left eye: No discharge.     Extraocular Movements: Extraocular movements intact.     Conjunctiva/sclera: Conjunctivae normal.     Pupils: Pupils are equal, round, and reactive to light.  Cardiovascular:     Rate and Rhythm: Normal rate and regular rhythm.     Heart sounds: No murmur heard. Pulmonary:     Effort: Pulmonary effort is normal. No respiratory distress.     Breath sounds: Normal breath sounds. No wheezing or rales.  Musculoskeletal:  Cervical back: Normal range of motion and neck supple.  Skin:    General: Skin is warm and dry.     Capillary Refill: Capillary refill takes less than 2 seconds.  Neurological:     General: No focal deficit present.     Mental Status: She is alert and oriented to person, place, and time. Mental status is at baseline.  Psychiatric:        Mood and Affect: Mood normal.        Behavior: Behavior normal.        Thought Content: Thought content normal.        Judgment: Judgment normal.    Results for orders placed or performed in visit on 12/01/20  [id number] 11+Oxyco+Alc+Crt-Bund  Result Value Ref Range   Ethanol Negative Cutoff=0.020 %   Amphetamines, Urine See Final Results Cutoff=1000 ng/mL   Barbiturate Negative Cutoff=200 ng/mL   BENZODIAZ UR QL Negative Cutoff=200 ng/mL   Cannabinoid Quant, Ur See Final Results Cutoff=50 ng/mL   Cocaine (Metabolite) Negative Cutoff=300 ng/mL   OPIATE SCREEN URINE Negative Cutoff=300 ng/mL   Oxycodone/Oxymorphone, Urine Negative Cutoff=300 ng/mL   Phencyclidine Negative Cutoff=25 ng/mL    Methadone Screen, Urine Negative Cutoff=300 ng/mL   Propoxyphene Negative Cutoff=300 ng/mL   Meperidine Negative Cutoff=200 ng/mL   Tramadol Negative Cutoff=200 ng/mL   Creatinine 204.7 20.0 - 300.0 mg/dL   pH, Urine 5.2 4.5 - 8.9  Drug Profile [phone]  Result Value Ref Range   Amphetamines Positive (A) Cutoff=1000   Amphetamine Positive (A)    Amphetamine GC/MS Conf >3000 Cutoff=500 ng/mL   Methamphetamine Negative Cutoff=500  Cannabinoid Conf, Ur  Result Value Ref Range   CANNABINOIDS Positive (A) Cutoff=50   Carboxy THC GC/MS Conf 692 Cutoff=15 ng/mL      Assessment & Plan:   Problem List Items Addressed This Visit       Other   Depression, recurrent ([place]) - Primary    Chronic. Improving. Will increase Lexapro to 20mg .  Will extend FMLA paperwork for a return to work date of 06/04/2021.  Follow up in 1 week for reevaluation.        Follow up plan: Return in about 1 week (around 05/22/2021) for Depression/Anxiety FU.

## 2021-05-18 ENCOUNTER — Encounter: Payer: Self-pay | Admitting: Nurse Practitioner

## 2021-05-20 ENCOUNTER — Encounter: Payer: Self-pay | Admitting: Nurse Practitioner

## 2021-05-22 ENCOUNTER — Encounter: Payer: Self-pay | Admitting: Nurse Practitioner

## 2021-05-22 ENCOUNTER — Other Ambulatory Visit: Payer: Self-pay

## 2021-05-22 ENCOUNTER — Ambulatory Visit: Payer: BC Managed Care – PPO | Admitting: Nurse Practitioner

## 2021-05-22 VITALS — BP 106/53 | HR 67 | Temp 98.6°F | Wt 118.0 lb

## 2021-05-22 DIAGNOSIS — F900 Attention-deficit hyperactivity disorder, predominantly inattentive type: Secondary | ICD-10-CM

## 2021-05-22 DIAGNOSIS — F339 Major depressive disorder, recurrent, unspecified: Secondary | ICD-10-CM | POA: Diagnosis not present

## 2021-05-22 MED ORDER — DEXTROAMPHETAMINE SULFATE 10 MG PO TABS: 10.0000 mg | ORAL_TABLET | Freq: Every day | ORAL | 0 refills | Status: DC

## 2021-05-22 MED ORDER — AMPHETAMINE-DEXTROAMPHET ER 20 MG PO CP24: 20.0000 mg | ORAL_CAPSULE | Freq: Every day | ORAL | 0 refills | Status: DC

## 2021-05-22 MED ORDER — BREXPIPRAZOLE 0.25 MG PO TABS: 0.2500 mg | ORAL_TABLET | Freq: Every day | ORAL | 0 refills | Status: DC

## 2021-05-22 MED ORDER — ESCITALOPRAM OXALATE 20 MG PO TABS: 20.0000 mg | ORAL_TABLET | Freq: Every day | ORAL | 0 refills | Status: DC

## 2021-05-22 NOTE — Assessment & Plan Note (Signed)
Chronic. Not well controlled.  She is tolerating the Lexapro well.  Will also add Rexulti to patient's regimen.  Side effects and benefits of medication discussed with patient during visit. Follow up in 2 weeks for reevaluation.

## 2021-05-22 NOTE — Assessment & Plan Note (Signed)
Refills sent for patient today.  PDMP checked.  Will follow up in 2 weeks.

## 2021-05-22 NOTE — Progress Notes (Signed)
BP (!) 106/53    Pulse 67    Temp 98.6 F (37 C) (Oral)    Wt 118 lb (53.5 kg)    LMP 10/25/2011    SpO2 98%    BMI 18.30 kg/m    Subjective:    Patient ID: Chelsea Velez, female    DOB: 09-15-1965, 56 y.o.   MRN: [id number]  HPI: Chelsea Velez is a 56 y.o. female  Chief Complaint  Patient presents with   Depression    DEPRESSION Patient states she has a really rough week.  Her job continues to be the biggest source of stress.  She feels like she needs to return to work.  She feels like the Lexapro makes her tired. She increased the dose to Lexapro 20mg  probably 4/7 days. She does have thoughts that she would be better off not here.  Does not have a plan.  States she continues to live for her son.   [place] Office Visit from 05/22/2021 in [facility]  PHQ-9 Total Score 16      GAD 7 : Generalized Anxiety Score 05/22/2021 05/15/2021 05/06/2021 04/22/2021  Nervous, Anxious, on Edge 0 1 1 1   Control/stop worrying 0 0 1 1  Worry too much - different things 0 0 1 1  Trouble relaxing 0 1 1 2   Restless 1 1 1 2   Easily annoyed or irritable 2 0 3 2  Afraid - awful might happen 0 0 1 1  Total GAD 7 Score 3 3 9 10   Anxiety Difficulty Very difficult Somewhat difficult Somewhat difficult -     Relevant past medical, surgical, family and social history reviewed and updated as indicated. Interim medical history since our last visit reviewed. Allergies and medications reviewed and updated.  Review of Systems  Psychiatric/Behavioral:  Positive for dysphoric mood and suicidal ideas. The patient is nervous/anxious.    Per HPI unless specifically indicated above     Objective:    BP (!) 106/53    Pulse 67    Temp 98.6 F (37 C) (Oral)    Wt 118 lb (53.5 kg)    LMP 10/25/2011    SpO2 98%    BMI 18.30 kg/m   Wt Readings from Last 3 Encounters:  05/22/21 118 lb (53.5 kg)  05/06/21 115 lb 12.8 oz (52.5 kg)  04/22/21 115 lb 6.4 oz (52.3 kg)    Physical Exam Vitals  and nursing note reviewed.  Constitutional:      General: She is not in acute distress.    Appearance: Normal appearance. She is normal weight. She is not ill-appearing, toxic-appearing or diaphoretic.  HENT:     Head: Normocephalic.     Right Ear: External ear normal.     Left Ear: External ear normal.     Nose: Nose normal.     Mouth/Throat:     Mouth: Mucous membranes are moist.     Pharynx: Oropharynx is clear.  Eyes:     General:        Right eye: No discharge.        Left eye: No discharge.     Extraocular Movements: Extraocular movements intact.     Conjunctiva/sclera: Conjunctivae normal.     Pupils: Pupils are equal, round, and reactive to light.  Cardiovascular:     Rate and Rhythm: Normal rate and regular rhythm.     Heart sounds: No murmur heard. Pulmonary:     Effort: Pulmonary effort is normal.  No respiratory distress.     Breath sounds: Normal breath sounds. No wheezing or rales.  Musculoskeletal:     Cervical back: Normal range of motion and neck supple.  Skin:    General: Skin is warm and dry.     Capillary Refill: Capillary refill takes less than 2 seconds.  Neurological:     General: No focal deficit present.     Mental Status: She is alert and oriented to person, place, and time. Mental status is at baseline.  Psychiatric:        Mood and Affect: Mood normal.        Behavior: Behavior normal.        Thought Content: Thought content normal.        Judgment: Judgment normal.    Results for orders placed or performed in visit on 12/01/20  [id number] 11+Oxyco+Alc+Crt-Bund  Result Value Ref Range   Ethanol Negative Cutoff=0.020 %   Amphetamines, Urine See Final Results Cutoff=1000 ng/mL   Barbiturate Negative Cutoff=200 ng/mL   BENZODIAZ UR QL Negative Cutoff=200 ng/mL   Cannabinoid Quant, Ur See Final Results Cutoff=50 ng/mL   Cocaine (Metabolite) Negative Cutoff=300 ng/mL   OPIATE SCREEN URINE Negative Cutoff=300 ng/mL   Oxycodone/Oxymorphone, Urine  Negative Cutoff=300 ng/mL   Phencyclidine Negative Cutoff=25 ng/mL   Methadone Screen, Urine Negative Cutoff=300 ng/mL   Propoxyphene Negative Cutoff=300 ng/mL   Meperidine Negative Cutoff=200 ng/mL   Tramadol Negative Cutoff=200 ng/mL   Creatinine 204.7 20.0 - 300.0 mg/dL   pH, Urine 5.2 4.5 - 8.9  Drug Profile [phone]  Result Value Ref Range   Amphetamines Positive (A) Cutoff=1000   Amphetamine Positive (A)    Amphetamine GC/MS Conf >3000 Cutoff=500 ng/mL   Methamphetamine Negative Cutoff=500  Cannabinoid Conf, Ur  Result Value Ref Range   CANNABINOIDS Positive (A) Cutoff=50   Carboxy THC GC/MS Conf 692 Cutoff=15 ng/mL      Assessment & Plan:   Problem List Items Addressed This Visit       Other   Attention deficit hyperactivity disorder (ADHD), predominantly inattentive type    Refills sent for patient today.  PDMP checked.  Will follow up in 2 weeks.      Relevant Medications   amphetamine-dextroamphetamine (ADDERALL XR) 20 MG 24 hr capsule   Depression, recurrent ([facility]) - Primary    Chronic. Not well controlled.  She is tolerating the Lexapro well.  Will also add Rexulti to patient's regimen.  Side effects and benefits of medication discussed with patient during visit. Follow up in 2 weeks for reevaluation.      Relevant Medications   escitalopram (LEXAPRO) 20 MG tablet     Follow up plan: Return in about 2 weeks (around 06/05/2021) for Depression/Anxiety FU.

## 2021-05-27 DIAGNOSIS — F331 Major depressive disorder, recurrent, moderate: Secondary | ICD-10-CM | POA: Diagnosis not present

## 2021-06-11 ENCOUNTER — Other Ambulatory Visit: Payer: Self-pay

## 2021-06-11 ENCOUNTER — Ambulatory Visit: Payer: BC Managed Care – PPO | Admitting: Nurse Practitioner

## 2021-06-11 ENCOUNTER — Encounter: Payer: Self-pay | Admitting: Nurse Practitioner

## 2021-06-11 VITALS — BP 122/69 | HR 69 | Temp 97.5°F | Ht 67.32 in | Wt 120.2 lb

## 2021-06-11 DIAGNOSIS — R350 Frequency of micturition: Secondary | ICD-10-CM

## 2021-06-11 DIAGNOSIS — F339 Major depressive disorder, recurrent, unspecified: Secondary | ICD-10-CM | POA: Diagnosis not present

## 2021-06-11 DIAGNOSIS — N3000 Acute cystitis without hematuria: Secondary | ICD-10-CM | POA: Diagnosis not present

## 2021-06-11 LAB — URINALYSIS, ROUTINE W REFLEX MICROSCOPIC
Bilirubin, UA: NEGATIVE
Glucose, UA: NEGATIVE
Ketones, UA: NEGATIVE
Leukocytes,UA: NEGATIVE
Nitrite, UA: NEGATIVE
Protein,UA: NEGATIVE
RBC, UA: NEGATIVE
Specific Gravity, UA: >1.03 — ABNORMAL HIGH (ref 1.005–1.030)
Urobilinogen, Ur: 0.2 mg/dL (ref 0.2–1.0)
pH, UA: 5.5 (ref 5.0–7.5)

## 2021-06-11 MED ORDER — NITROFURANTOIN MONOHYD MACRO 100 MG PO CAPS: 100.0000 mg | ORAL_CAPSULE | Freq: Two times a day (BID) | ORAL | 0 refills | Status: DC

## 2021-06-11 MED ORDER — ESCITALOPRAM OXALATE 20 MG PO TABS: 20.0000 mg | ORAL_TABLET | Freq: Every day | ORAL | 1 refills | Status: DC

## 2021-06-11 MED ORDER — BREXPIPRAZOLE 0.25 MG PO TABS: 0.2500 mg | ORAL_TABLET | Freq: Every day | ORAL | 1 refills | Status: DC

## 2021-06-11 NOTE — Assessment & Plan Note (Signed)
Chronic.  Controlled.  Continue with current medication regimen of Lexapro 20mg  and Rexulti 0.25mg  daily.  Refills sent today. Return to clinic in 1 months for reevaluation.  Call sooner if concerns arise.  ? ?

## 2021-06-11 NOTE — Progress Notes (Signed)
? ?BP 122/69   Pulse 69   Temp (!) 97.5 ?F (36.4 ?C) (Oral)   Wt 120 lb 3.2 oz (54.5 kg)   LMP 10/25/2011   SpO2 97%   BMI 18.65 kg/m?   ? ?Subjective:  ? ? Patient ID: Chelsea Velez, female    DOB: 18-Nov-1965, 56 y.o.   MRN: [id number] ? ?HPI: ?Chelsea Velez is a 56 y.o. female ? ?Chief Complaint  ?Patient presents with  ? Depression  ? ? ?DEPRESSION ?Patient states she is feeling better today. States the medication regimen that she is currently on is much better for her.  She feels a sense of relief now that she is on this regimen.   ? ? ?[place] Office Visit from 05/22/2021 in [facility]  ?PHQ-9 Total Score 16  ? ?  ? ?GAD 7 : Generalized Anxiety Score 05/22/2021 05/15/2021 05/06/2021 04/22/2021  ?Nervous, Anxious, on Edge 0 1 1 1   ?Control/stop worrying 0 0 1 1  ?Worry too much - different things 0 0 1 1  ?Trouble relaxing 0 1 1 2   ?Restless 1 1 1 2   ?Easily annoyed or irritable 2 0 3 2  ?Afraid - awful might happen 0 0 1 1  ?Total GAD 7 Score 3 3 9 10   ?Anxiety Difficulty Very difficult Somewhat difficult Somewhat difficult -  ? ? ?URINARY SYMPTOMS ?Dysuria: no ?Urinary frequency: yes ?Urgency: yes ?Small volume voids: yes ?Symptom severity: no ?Urinary incontinence: no ?Foul odor: no ?Hematuria: no ?Abdominal pain: no ?Back pain: no ?Suprapubic pain/pressure: no ?Flank pain: no ?Fever:  no ?Vomiting: no ? ?Relevant past medical, surgical, family and social history reviewed and updated as indicated. Interim medical history since our last visit reviewed. ?Allergies and medications reviewed and updated. ? ?Review of Systems  ?Constitutional:  Negative for fever.  ?Gastrointestinal:  Negative for abdominal pain and vomiting.  ?Genitourinary:  Positive for dysuria, frequency and urgency. Negative for decreased urine volume, flank pain and hematuria.  ?Musculoskeletal:  Negative for back pain.  ?Psychiatric/Behavioral:  Positive for dysphoric mood. Negative for suicidal ideas. The patient is  nervous/anxious.   ? ?Per HPI unless specifically indicated above ? ?   ?Objective:  ?  ?BP 122/69   Pulse 69   Temp (!) 97.5 ?F (36.4 ?C) (Oral)   Wt 120 lb 3.2 oz (54.5 kg)   LMP 10/25/2011   SpO2 97%   BMI 18.65 kg/m?   ?Wt Readings from Last 3 Encounters:  ?06/11/21 120 lb 3.2 oz (54.5 kg)  ?05/22/21 118 lb (53.5 kg)  ?05/06/21 115 lb 12.8 oz (52.5 kg)  ?  ?Physical Exam ?Vitals and nursing note reviewed.  ?Constitutional:   ?   General: She is not in acute distress. ?   Appearance: Normal appearance. She is normal weight. She is not ill-appearing, toxic-appearing or diaphoretic.  ?HENT:  ?   Head: Normocephalic.  ?   Right Ear: External ear normal.  ?   Left Ear: External ear normal.  ?   Nose: Nose normal.  ?   Mouth/Throat:  ?   Mouth: Mucous membranes are moist.  ?   Pharynx: Oropharynx is clear.  ?Eyes:  ?   General:     ?   Right eye: No discharge.     ?   Left eye: No discharge.  ?   Extraocular Movements: Extraocular movements intact.  ?   Conjunctiva/sclera: Conjunctivae normal.  ?   Pupils: Pupils are equal, round, and  reactive to light.  ?Cardiovascular:  ?   Rate and Rhythm: Normal rate and regular rhythm.  ?   Heart sounds: No murmur heard. ?Pulmonary:  ?   Effort: Pulmonary effort is normal. No respiratory distress.  ?   Breath sounds: Normal breath sounds. No wheezing or rales.  ?Musculoskeletal:  ?   Cervical back: Normal range of motion and neck supple.  ?Skin: ?   General: Skin is warm and dry.  ?   Capillary Refill: Capillary refill takes less than 2 seconds.  ?Neurological:  ?   General: No focal deficit present.  ?   Mental Status: She is alert and oriented to person, place, and time. Mental status is at baseline.  ?Psychiatric:     ?   Mood and Affect: Mood normal.     ?   Behavior: Behavior normal.     ?   Thought Content: Thought content normal.     ?   Judgment: Judgment normal.  ? ? ?Results for orders placed or performed in visit on 12/01/20  ?[id number] 11+Oxyco+Alc+Crt-Bund   ?Result Value Ref Range  ? Ethanol Negative Cutoff=0.020 %  ? Amphetamines, Urine See Final Results Cutoff=1000 ng/mL  ? Barbiturate Negative Cutoff=200 ng/mL  ? BENZODIAZ UR QL Negative Cutoff=200 ng/mL  ? Cannabinoid Quant, Ur See Final Results Cutoff=50 ng/mL  ? Cocaine (Metabolite) Negative Cutoff=300 ng/mL  ? OPIATE SCREEN URINE Negative Cutoff=300 ng/mL  ? Oxycodone/Oxymorphone, Urine Negative Cutoff=300 ng/mL  ? Phencyclidine Negative Cutoff=25 ng/mL  ? Methadone Screen, Urine Negative Cutoff=300 ng/mL  ? Propoxyphene Negative Cutoff=300 ng/mL  ? Meperidine Negative Cutoff=200 ng/mL  ? Tramadol Negative Cutoff=200 ng/mL  ? Creatinine 204.7 20.0 - 300.0 mg/dL  ? pH, Urine 5.2 4.5 - 8.9  ?Drug Profile [id number]  ?Result Value Ref Range  ? Amphetamines Positive (A) Cutoff=1000  ? Amphetamine Positive (A)   ? Amphetamine GC/MS Conf >3000 Cutoff=500 ng/mL  ? Methamphetamine Negative Cutoff=500  ?Cannabinoid Conf, Ur  ?Result Value Ref Range  ? CANNABINOIDS Positive (A) Cutoff=50  ? Carboxy THC GC/MS Conf 692 Cutoff=15 ng/mL  ? ?   ?Assessment & Plan:  ? ?Problem List Items Addressed This Visit   ? ?  ? Other  ? Depression, recurrent ([place]) - Primary  ?  Chronic.  Controlled.  Continue with current medication regimen of Lexapro 20mg  and Rexulti 0.25mg  daily.  Refills sent today. Return to clinic in 1 months for reevaluation.  Call sooner if concerns arise.  ? ?  ?  ? Relevant Medications  ? escitalopram (LEXAPRO) 20 MG tablet  ? ?Other Visit Diagnoses   ? ? Urinary frequency      ? Relevant Orders  ? Urinalysis, Routine w reflex microscopic  ? Acute cystitis without hematuria      ? Relevant Orders  ? Urine Culture  ? ?  ?  ? ?Follow up plan: ?Return in about 1 month (around 07/12/2021) for Depression/Anxiety FU, ADHD FU. ? ? ? ? ? ? ?

## 2021-06-12 NOTE — Progress Notes (Signed)
Results discussed with patient during visit.

## 2021-06-14 LAB — URINE CULTURE: Organism ID, Bacteria: NO GROWTH

## 2021-06-15 DIAGNOSIS — F331 Major depressive disorder, recurrent, moderate: Secondary | ICD-10-CM | POA: Diagnosis not present

## 2021-06-15 NOTE — Progress Notes (Signed)
Hi Romie Minus. Your urine did not grow any bacteria.  If you symptoms do not resolve please come back and see me.  Otherwise, I will see you at our next visit.

## 2021-06-22 ENCOUNTER — Other Ambulatory Visit: Payer: Self-pay | Admitting: Nurse Practitioner

## 2021-06-22 DIAGNOSIS — F900 Attention-deficit hyperactivity disorder, predominantly inattentive type: Secondary | ICD-10-CM

## 2021-06-23 ENCOUNTER — Encounter: Payer: Self-pay | Admitting: Nurse Practitioner

## 2021-06-23 NOTE — Telephone Encounter (Signed)
Requested medication (s) are due for refill today:  ? ?Requested medication (s) are on the active medication list: Yes ? ?Last refill:  05/22/21 ? ?Future visit scheduled: Yes ? ?Notes to clinic:  See request. ? ? ? ?Requested Prescriptions  ?Pending Prescriptions Disp Refills  ? dextroamphetamine (DEXTROSTAT) 10 MG tablet [Pharmacy Med Name: DEXTROAMPHETAMINE 10 MG TAB] 60 tablet 0  ?  Sig: Take 1 tablet (10 mg total) by mouth daily.  ?  ? Not Delegated - Psychiatry:  Stimulants/ADHD Failed - 06/22/2021 10:51 AM  ?  ?  Failed - This refill cannot be delegated  ?  ?  Passed - Urine Drug Screen completed in last 360 days  ?  ?  Passed - Last BP in normal range  ?  BP Readings from Last 1 Encounters:  ?06/11/21 122/69  ?  ?  ?  ?  Passed - Last Heart Rate in normal range  ?  Pulse Readings from Last 1 Encounters:  ?06/11/21 69  ?  ?  ?  ?  Passed - Valid encounter within last 6 months  ?  Recent Outpatient Visits   ? ?      ? 1 week ago Depression, recurrent ([place])  ? [place], NP  ? 1 month ago Depression, recurrent ([place])  ? [place], NP  ? 1 month ago Depression, recurrent ([place])  ? [place], NP  ? 1 month ago Depression, recurrent ([place])  ? [place], NP  ? 2 months ago Depression, recurrent ([place])  ? [facility] Jon Billings, NP  ? ?  ?  ?Future Appointments   ? ?        ? In 2 weeks Jon Billings, NP [facility]  ? ?  ? ?  ?  ?  ? amphetamine-dextroamphetamine (ADDERALL XR) 20 MG 24 hr capsule [Pharmacy Med Name: DEXTROAMP-AMPHET ER 20 MG CAP] 30 capsule 0  ?  Sig: Take 1 capsule (20 mg total) by mouth daily.  ?  ? Not Delegated - Psychiatry:  Stimulants/ADHD Failed - 06/22/2021 10:51 AM  ?  ?  Failed - This refill cannot be delegated  ?  ?  Passed - Urine Drug Screen completed in last 360 days  ?  ?  Passed - Last BP in normal range  ?  BP  Readings from Last 1 Encounters:  ?06/11/21 122/69  ?  ?  ?  ?  Passed - Last Heart Rate in normal range  ?  Pulse Readings from Last 1 Encounters:  ?06/11/21 69  ?  ?  ?  ?  Passed - Valid encounter within last 6 months  ?  Recent Outpatient Visits   ? ?      ? 1 week ago Depression, recurrent ([place])  ? [place], NP  ? 1 month ago Depression, recurrent ([place])  ? [place], NP  ? 1 month ago Depression, recurrent ([place])  ? [place], NP  ? 1 month ago Depression, recurrent ([place])  ? [place], NP  ? 2 months ago Depression, recurrent ([place])  ? [facility] Jon Billings, NP  ? ?  ?  ?Future Appointments   ? ?        ? In 2 weeks Jon Billings, NP [facility]  ? ?  ? ?  ?  ?  ? ?

## 2021-07-13 ENCOUNTER — Encounter: Payer: Self-pay | Admitting: Nurse Practitioner

## 2021-07-13 ENCOUNTER — Ambulatory Visit: Payer: BC Managed Care – PPO | Admitting: Nurse Practitioner

## 2021-07-13 VITALS — BP 116/70 | HR 79 | Temp 98.6°F | Wt 121.2 lb

## 2021-07-13 DIAGNOSIS — F900 Attention-deficit hyperactivity disorder, predominantly inattentive type: Secondary | ICD-10-CM

## 2021-07-13 DIAGNOSIS — F339 Major depressive disorder, recurrent, unspecified: Secondary | ICD-10-CM

## 2021-07-13 MED ORDER — DEXTROAMPHETAMINE SULFATE 10 MG PO TABS: 10.0000 mg | ORAL_TABLET | Freq: Every day | ORAL | 0 refills | Status: DC

## 2021-07-13 MED ORDER — AMPHETAMINE-DEXTROAMPHET ER 20 MG PO CP24: 20.0000 mg | ORAL_CAPSULE | Freq: Every day | ORAL | 0 refills | Status: DC

## 2021-07-13 MED ORDER — ESCITALOPRAM OXALATE 20 MG PO TABS: 20.0000 mg | ORAL_TABLET | Freq: Every day | ORAL | 1 refills | Status: DC

## 2021-07-13 NOTE — Assessment & Plan Note (Signed)
Chronic.  Controlled.  Continue with current medication regimen of Lexapro 20mg daily.  Refills sent today.   Return to clinic in 3 months for reevaluation.  Call sooner if concerns arise.   

## 2021-07-13 NOTE — Assessment & Plan Note (Signed)
Chronic.  Well controlled on current medication regimen of Adderall '20mg'$  XR and Dextrostat 10 BID.  Refills sent for patient today.  PDMP checked.  Will follow up in 3 months. ?

## 2021-07-13 NOTE — Progress Notes (Signed)
? ?BP 116/70   Pulse 79   Temp 98.6 ?F (37 ?C) (Oral)   Wt 121 lb 3.2 oz (55 kg)   LMP 10/25/2011   SpO2 99%   BMI 18.80 kg/m?   ? ?Subjective:  ? ? Patient ID: Chelsea Velez, female    DOB: 05/21/1965, 56 y.o.   MRN: [id number] ? ?HPI: ?Chelsea Velez is a 56 y.o. female ? ?Chief Complaint  ?Patient presents with  ? Depression  ? Anxiety  ? ? ?DEPRESSION ?Patient states she is feeling better today. States the medication regimen that she is currently on is much better for her.  She feels a sense of relief now that she is on this regimen.   ? ? ?[place] Office Visit from 06/11/2021 in [place]  ?PHQ-9 Total Score 8  ? ?  ? ? ?  06/11/2021  ?  4:02 PM 05/22/2021  ? 11:57 AM 05/15/2021  ? 10:58 AM 05/06/2021  ? 11:21 AM  ?GAD 7 : Generalized Anxiety Score  ?Nervous, Anxious, on Edge 2 0 1 1  ?Control/stop worrying 0 0 0 1  ?Worry too much - different things 0 0 0 1  ?Trouble relaxing 0 0 1 1  ?Restless 0 '1 1 1  '$ ?Easily annoyed or irritable 2 2 0 3  ?Afraid - awful might happen 0 0 0 1  ?Total GAD 7 Score '4 3 3 9  '$ ?Anxiety Difficulty Not difficult at all Very difficult Somewhat difficult Somewhat difficult  ? ? ?ADHD FOLLOW UP ?ADHD status: controlled ?Satisfied with current therapy: yes ?Medication compliance:  excellent compliance ?Controlled substance contract: yes ?Previous psychiatry evaluation: yes ?Previous medications: yes adderall and adderall XR   ?Taking meds on weekends/vacations: yes ?Work/school performance:  excellent ?Difficulty sustaining attention/completing tasks: no ?Distracted by extraneous stimuli: no ?Does not listen when spoken to: no  ?Fidgets with hands or feet: no ?Unable to stay in seat: no ?Blurts out/interrupts others: no ?ADHD Medication Side Effects: no ?   Decreased appetite: no ?   Headache: no ?   Sleeping disturbance pattern: no ?   Irritability: no ?   Rebound effects (worse than baseline) off medication: no ?   Anxiousness: no ?   Dizziness: no ?   Tics:  no ? ? ?Relevant past medical, surgical, family and social history reviewed and updated as indicated. Interim medical history since our last visit reviewed. ?Allergies and medications reviewed and updated. ? ?Review of Systems  ?Psychiatric/Behavioral:  Positive for dysphoric mood. Negative for suicidal ideas. The patient is nervous/anxious.   ? ?Per HPI unless specifically indicated above ? ?   ?Objective:  ?  ?BP 116/70   Pulse 79   Temp 98.6 ?F (37 ?C) (Oral)   Wt 121 lb 3.2 oz (55 kg)   LMP 10/25/2011   SpO2 99%   BMI 18.80 kg/m?   ?Wt Readings from Last 3 Encounters:  ?07/13/21 121 lb 3.2 oz (55 kg)  ?06/11/21 120 lb 3.2 oz (54.5 kg)  ?05/22/21 118 lb (53.5 kg)  ?  ?Physical Exam ?Vitals and nursing note reviewed.  ?Constitutional:   ?   General: She is not in acute distress. ?   Appearance: Normal appearance. She is normal weight. She is not ill-appearing, toxic-appearing or diaphoretic.  ?HENT:  ?   Head: Normocephalic.  ?   Right Ear: External ear normal.  ?   Left Ear: External ear normal.  ?   Nose: Nose normal.  ?  Mouth/Throat:  ?   Mouth: Mucous membranes are moist.  ?   Pharynx: Oropharynx is clear.  ?Eyes:  ?   General:     ?   Right eye: No discharge.     ?   Left eye: No discharge.  ?   Extraocular Movements: Extraocular movements intact.  ?   Conjunctiva/sclera: Conjunctivae normal.  ?   Pupils: Pupils are equal, round, and reactive to light.  ?Cardiovascular:  ?   Rate and Rhythm: Normal rate and regular rhythm.  ?   Heart sounds: No murmur heard. ?Pulmonary:  ?   Effort: Pulmonary effort is normal. No respiratory distress.  ?   Breath sounds: Normal breath sounds. No wheezing or rales.  ?Musculoskeletal:  ?   Cervical back: Normal range of motion and neck supple.  ?Skin: ?   General: Skin is warm and dry.  ?   Capillary Refill: Capillary refill takes less than 2 seconds.  ?Neurological:  ?   General: No focal deficit present.  ?   Mental Status: She is alert and oriented to person, place,  and time. Mental status is at baseline.  ?Psychiatric:     ?   Mood and Affect: Mood normal.     ?   Behavior: Behavior normal.     ?   Thought Content: Thought content normal.     ?   Judgment: Judgment normal.  ? ? ?Results for orders placed or performed in visit on 06/11/21  ?Urine Culture  ? Specimen: Urine  ? UR  ?Result Value Ref Range  ? Urine Culture, Routine Final report   ? Organism ID, Bacteria No growth   ?Urinalysis, Routine w reflex microscopic  ?Result Value Ref Range  ? Specific Gravity, UA >1.030 (H) 1.005 - 1.030  ? pH, UA 5.5 5.0 - 7.5  ? Color, UA Yellow Yellow  ? Appearance Ur Clear Clear  ? Leukocytes,UA Negative Negative  ? Protein,UA Negative Negative/Trace  ? Glucose, UA Negative Negative  ? Ketones, UA Negative Negative  ? RBC, UA Negative Negative  ? Bilirubin, UA Negative Negative  ? Urobilinogen, Ur 0.2 0.2 - 1.0 mg/dL  ? Nitrite, UA Negative Negative  ? ?   ?Assessment & Plan:  ? ?Problem List Items Addressed This Visit   ? ?  ? Other  ? Attention deficit hyperactivity disorder (ADHD), predominantly inattentive type - Primary  ?  Chronic.  Well controlled on current medication regimen of Adderall '20mg'$  XR and Dextrostat 10 BID.  Refills sent for patient today.  PDMP checked.  Will follow up in 3 months. ?  ?  ? Relevant Medications  ? amphetamine-dextroamphetamine (ADDERALL XR) 20 MG 24 hr capsule (Start on 07/24/2021)  ? Depression, recurrent ([place])  ?  Chronic.  Controlled.  Continue with current medication regimen of Lexapro '20mg'$  daily.  Refills sent today.  Return to clinic in 3 months for reevaluation.  Call sooner if concerns arise.  ? ?  ?  ? Relevant Medications  ? escitalopram (LEXAPRO) 20 MG tablet  ?  ? ?Follow up plan: ?Return in about 3 months (around 10/12/2021) for Depression/Anxiety FU, ADHD FU. ? ? ? ? ? ? ?

## 2021-09-23 ENCOUNTER — Ambulatory Visit: Payer: BC Managed Care – PPO | Admitting: Nurse Practitioner

## 2021-09-23 ENCOUNTER — Encounter: Payer: Self-pay | Admitting: Nurse Practitioner

## 2021-09-23 DIAGNOSIS — L299 Pruritus, unspecified: Secondary | ICD-10-CM | POA: Insufficient documentation

## 2021-09-23 MED ORDER — HYDROXYZINE PAMOATE 25 MG PO CAPS: 25.0000 mg | ORAL_CAPSULE | Freq: Three times a day (TID) | ORAL | 5 refills | Status: DC | PRN

## 2021-09-23 MED ORDER — GABAPENTIN 600 MG PO TABS: ORAL_TABLET | ORAL | 4 refills | Status: DC

## 2021-09-23 NOTE — Patient Instructions (Signed)
Pruritus Pruritus is an itchy feeling on the skin. One of the most common causes is dry skin, but many different things can cause itching. Most cases of itching do not require medical attention. Sometimes itchy skin can turn into a rash. Follow these instructions at home: Skin care  Apply moisturizing lotion to your skin as needed. Lotion that contains petroleum jelly is best. Take medicines or apply medicated creams only as told by your health care provider. This may include: Corticosteroid cream. Anti-itch lotions. Oral antihistamines. Apply a cool, wet cloth (cool compress) to the affected areas. Take baths with one of the following: Epsom salts. You can get these at your local pharmacy or grocery store. Follow the instructions on the packaging. Baking soda. Pour a small amount into the bath as told by your health care provider. Colloidal oatmeal. You can get this at your local pharmacy or grocery store. Follow the instructions on the packaging. Apply baking soda paste to your skin. To make the paste, stir water into a small amount of baking soda until it reaches a paste-like consistency. Do not scratch your skin. Do not take hot showers or baths, which can make itching worse. A cool shower may help with itching as long as you apply moisturizing lotion after the shower. Do not use scented soaps, detergents, perfumes, and cosmetic products. Instead, use gentle, unscented versions of these items. General instructions Avoid wearing tight clothes. Keep a journal to help find out what is causing your itching. Write down: What you eat and drink. What cosmetic products you use. What soaps or detergents you use. What you wear, including jewelry. Use a humidifier. This keeps the air moist, which helps to prevent dry skin. Be aware of any changes in your itchiness. Contact a health care provider if: The itching does not go away after several days. You are unusually thirsty or urinating more  than normal. Your skin tingles or feels numb. Your skin or the white parts of your eyes turn yellow (jaundice). You feel weak. You have any of the following: Night sweats. Tiredness (fatigue). Weight loss. Abdominal pain. Summary Pruritus is an itchy feeling on the skin. One of the most common causes is dry skin, but many different conditions and factors can cause itching. Apply moisturizing lotion to your skin as needed. Lotion that contains petroleum jelly is best. Take medicines or apply medicated creams only as told by your health care provider. Do not take hot showers or baths. Do not use scented soaps, detergents, perfumes, or cosmetic products. This information is not intended to replace advice given to you by your health care provider. Make sure you discuss any questions you have with your health care provider. Document Revised: 01/12/2021 Document Reviewed: 01/12/2021 Elsevier Patient Education  2022 Elsevier Inc.  

## 2021-09-23 NOTE — Assessment & Plan Note (Signed)
Ongoing issue since 2013, multiple dermatology visits, worsening since shingles one year ago.  To mid to left upper back.  ?notalgia paresthetica.  Will start Gabapentin 300 MG QHS and if tolerates and ongoing symptoms may change to 300 MG BID in one week.  Educated her on this medication, use and side effects.  Vistaril script sent to use PRN for severe pruritus.  Plan for return in 4 weeks with PCP to reassess + obtain Shingrix vaccine # 1.

## 2021-09-23 NOTE — Progress Notes (Signed)
BP 127/67   Pulse 71   Temp 97.8 F (36.6 C) (Oral)   Ht 5' 7.32" (1.71 m)   Wt 123 lb (55.8 kg)   LMP 10/25/2011   SpO2 99%   BMI 19.08 kg/m    Subjective:    Patient ID: Chelsea Velez, female    DOB: [birth year], 56 y.o.   MRN: [id number]  HPI: Chelsea Velez is a 56 y.o. female  Chief Complaint  Patient presents with   itchy back    Patient is here for itchy back condition. Patient states she first noticed it back in 2013 and has seen 3 different dermatologist office. Patient constant and says it feels like pin needles in her upper left side of her back. Patient states the area is darker in the area. Patient denies having diagnosis of Psoriasis. Patient says she has tried some CBD ointment. Patient states it is age related.     Herpes Zoster    Patient states she has had a history of Shingles flare up about a year ago. Patient states she thinks it may have gotten worse since her last flare up. Patient states it is always a prickly sensation on her upper left side of her back.    PRURITUS TO BACK Reports this as an ongoing issue since 2013, has seen 3 dermatologists for this. Feels like pins and needles, even wearing clothes can bother her.  CBD ointment tried for this.  Has a history of shingles to back, but upper back one year ago (after current pruritus started) -- the pruritus became worse after shingles to upper left side back.  The heat has made it worse recently -- can not sleep.   Duration:  chronic  Location:  to left side of back   Itching: yes Burning: yes Redness: no Oozing: no Scaling: no Blisters: no Painful: yes Fevers: no Change in detergents/soaps/personal care products: no Recent illness: no Recent travel:no History of same: yes Context: worse Alleviating factors: CBD ointment Treatments attempted: CBD ointment Shortness of breath: no  Throat/tongue swelling: no Myalgias/arthralgias: no   Relevant past medical, surgical, family and social  history reviewed and updated as indicated. Interim medical history since our last visit reviewed. Allergies and medications reviewed and updated.  Review of Systems  Constitutional:  Negative for activity change, appetite change, diaphoresis, fatigue and fever.  Respiratory:  Negative for cough, chest tightness, shortness of breath and wheezing.   Cardiovascular:  Negative for chest pain, palpitations and leg swelling.  Skin:  Negative for rash.  Neurological: Negative.   Psychiatric/Behavioral: Negative.      Per HPI unless specifically indicated above     Objective:    BP 127/67   Pulse 71   Temp 97.8 F (36.6 C) (Oral)   Ht 5' 7.32" (1.71 m)   Wt 123 lb (55.8 kg)   LMP 10/25/2011   SpO2 99%   BMI 19.08 kg/m   Wt Readings from Last 3 Encounters:  09/23/21 123 lb (55.8 kg)  07/13/21 121 lb 3.2 oz (55 kg)  06/11/21 120 lb 3.2 oz (54.5 kg)    Physical Exam Vitals and nursing note reviewed.  Constitutional:      General: She is awake. She is not in acute distress.    Appearance: She is well-developed and well-groomed. She is not ill-appearing or toxic-appearing.  HENT:     Head: Normocephalic.     Right Ear: Hearing normal.     Left Ear: Hearing normal.  Eyes:     General: Lids are normal.        Right eye: No discharge.        Left eye: No discharge.     Conjunctiva/sclera: Conjunctivae normal.     Pupils: Pupils are equal, round, and reactive to light.  Neck:     Vascular: No carotid bruit.  Cardiovascular:     Rate and Rhythm: Normal rate and regular rhythm.     Heart sounds: Normal heart sounds. No murmur heard.    No gallop.  Pulmonary:     Effort: Pulmonary effort is normal. No accessory muscle usage or respiratory distress.     Breath sounds: Normal breath sounds.  Abdominal:     General: Bowel sounds are normal.     Palpations: Abdomen is soft. There is no hepatomegaly or splenomegaly.  Musculoskeletal:     Cervical back: Normal range of motion and  neck supple.     Right lower leg: No edema.     Left lower leg: No edema.  Skin:    General: Skin is warm and dry.     Findings: No rash.       Neurological:     Mental Status: She is alert and oriented to person, place, and time.  Psychiatric:        Attention and Perception: Attention normal.        Mood and Affect: Mood normal.        Speech: Speech normal.        Behavior: Behavior normal. Behavior is cooperative.        Thought Content: Thought content normal.    Results for orders placed or performed in visit on 06/11/21  Urine Culture   Specimen: Urine   UR  Result Value Ref Range   Urine Culture, Routine Final report    Organism ID, Bacteria No growth   Urinalysis, Routine w reflex microscopic  Result Value Ref Range   Specific Gravity, UA >1.030 (H) 1.005 - 1.030   pH, UA 5.5 5.0 - 7.5   Color, UA Yellow Yellow   Appearance Ur Clear Clear   Leukocytes,UA Negative Negative   Protein,UA Negative Negative/Trace   Glucose, UA Negative Negative   Ketones, UA Negative Negative   RBC, UA Negative Negative   Bilirubin, UA Negative Negative   Urobilinogen, Ur 0.2 0.2 - 1.0 mg/dL   Nitrite, UA Negative Negative      Assessment & Plan:   Problem List Items Addressed This Visit       Other   Pruritus    Ongoing issue since 2013, multiple dermatology visits, worsening since shingles one year ago.  To mid to left upper back.  ?notalgia paresthetica.  Will start Gabapentin 300 MG QHS and if tolerates and ongoing symptoms may change to 300 MG BID in one week.  Educated her on this medication, use and side effects.  Vistaril script sent to use PRN for severe pruritus.  Plan for return in 4 weeks with PCP to reassess + obtain Shingrix vaccine # 1.        Follow up plan: Return in about 4 weeks (around 10/21/2021) for PRURITUS with PCP + Shingrix # 1.

## 2021-09-26 DIAGNOSIS — S93401A Sprain of unspecified ligament of right ankle, initial encounter: Secondary | ICD-10-CM | POA: Diagnosis not present

## 2021-09-26 DIAGNOSIS — S8261XA Displaced fracture of lateral malleolus of right fibula, initial encounter for closed fracture: Secondary | ICD-10-CM | POA: Diagnosis not present

## 2021-09-29 DIAGNOSIS — M25571 Pain in right ankle and joints of right foot: Secondary | ICD-10-CM | POA: Diagnosis not present

## 2021-09-29 DIAGNOSIS — M25579 Pain in unspecified ankle and joints of unspecified foot: Secondary | ICD-10-CM | POA: Insufficient documentation

## 2021-09-29 DIAGNOSIS — S82831A Other fracture of upper and lower end of right fibula, initial encounter for closed fracture: Secondary | ICD-10-CM | POA: Diagnosis not present

## 2021-10-05 DIAGNOSIS — M25571 Pain in right ankle and joints of right foot: Secondary | ICD-10-CM | POA: Diagnosis not present

## 2021-10-12 NOTE — Progress Notes (Signed)
LMP 10/25/2011    Subjective:    Patient ID: Chelsea Velez, female    DOB: [birth year], 56 y.o.   MRN: [id number]  HPI: Chelsea Velez is a 56 y.o. female  No chief complaint on file.   DEPRESSION Patient states she is feeling better today. States the medication regimen that she is currently on is much better for her.  She feels a sense of relief now that she is on this regimen.     [place] Office Visit from 07/13/2021 in [place]  PHQ-9 Total Score 9        07/13/2021    4:52 PM 06/11/2021    4:02 PM 05/22/2021   11:57 AM 05/15/2021   10:58 AM  GAD 7 : Generalized Anxiety Score  Nervous, Anxious, on Edge 0 2 0 1  Control/stop worrying 0 0 0 0  Worry too much - different things 0 0 0 0  Trouble relaxing 1 0 0 1  Restless 1 0 1 1  Easily annoyed or irritable '1 2 2 '$ 0  Afraid - awful might happen 0 0 0 0  Total GAD 7 Score '3 4 3 3  '$ Anxiety Difficulty Not difficult at all Not difficult at all Very difficult Somewhat difficult    ADHD FOLLOW UP ADHD status: controlled Satisfied with current therapy: yes Medication compliance:  excellent compliance Controlled substance contract: yes Previous psychiatry evaluation: yes Previous medications: yes adderall and adderall XR   Taking meds on weekends/vacations: yes Work/school performance:  excellent Difficulty sustaining attention/completing tasks: no Distracted by extraneous stimuli: no Does not listen when spoken to: no  Fidgets with hands or feet: no Unable to stay in seat: no Blurts out/interrupts others: no ADHD Medication Side Effects: no    Decreased appetite: no    Headache: no    Sleeping disturbance pattern: no    Irritability: no    Rebound effects (worse than baseline) off medication: no    Anxiousness: no    Dizziness: no    Tics: no   Relevant past medical, surgical, family and social history reviewed and updated as indicated. Interim medical history since our last visit  reviewed. Allergies and medications reviewed and updated.  Review of Systems  Psychiatric/Behavioral:  Positive for dysphoric mood. Negative for suicidal ideas. The patient is nervous/anxious.    Per HPI unless specifically indicated above     Objective:    LMP 10/25/2011   Wt Readings from Last 3 Encounters:  09/23/21 123 lb (55.8 kg)  07/13/21 121 lb 3.2 oz (55 kg)  06/11/21 120 lb 3.2 oz (54.5 kg)    Physical Exam Vitals and nursing note reviewed.  Constitutional:      General: She is not in acute distress.    Appearance: Normal appearance. She is normal weight. She is not ill-appearing, toxic-appearing or diaphoretic.  HENT:     Head: Normocephalic.     Right Ear: External ear normal.     Left Ear: External ear normal.     Nose: Nose normal.     Mouth/Throat:     Mouth: Mucous membranes are moist.     Pharynx: Oropharynx is clear.  Eyes:     General:        Right eye: No discharge.        Left eye: No discharge.     Extraocular Movements: Extraocular movements intact.     Conjunctiva/sclera: Conjunctivae normal.     Pupils: Pupils are equal, round,  and reactive to light.  Cardiovascular:     Rate and Rhythm: Normal rate and regular rhythm.     Heart sounds: No murmur heard. Pulmonary:     Effort: Pulmonary effort is normal. No respiratory distress.     Breath sounds: Normal breath sounds. No wheezing or rales.  Musculoskeletal:     Cervical back: Normal range of motion and neck supple.  Skin:    General: Skin is warm and dry.     Capillary Refill: Capillary refill takes less than 2 seconds.  Neurological:     General: No focal deficit present.     Mental Status: She is alert and oriented to person, place, and time. Mental status is at baseline.  Psychiatric:        Mood and Affect: Mood normal.        Behavior: Behavior normal.        Thought Content: Thought content normal.        Judgment: Judgment normal.    Results for orders placed or performed in  visit on 06/11/21  Urine Culture   Specimen: Urine   UR  Result Value Ref Range   Urine Culture, Routine Final report    Organism ID, Bacteria No growth   Urinalysis, Routine w reflex microscopic  Result Value Ref Range   Specific Gravity, UA >1.030 (H) 1.005 - 1.030   pH, UA 5.5 5.0 - 7.5   Color, UA Yellow Yellow   Appearance Ur Clear Clear   Leukocytes,UA Negative Negative   Protein,UA Negative Negative/Trace   Glucose, UA Negative Negative   Ketones, UA Negative Negative   RBC, UA Negative Negative   Bilirubin, UA Negative Negative   Urobilinogen, Ur 0.2 0.2 - 1.0 mg/dL   Nitrite, UA Negative Negative      Assessment & Plan:   Problem List Items Addressed This Visit      Other   Attention deficit hyperactivity disorder (ADHD), predominantly inattentive type   Depression, recurrent ([place]) - Primary     Follow up plan: No follow-ups on file.

## 2021-10-14 ENCOUNTER — Ambulatory Visit: Payer: BC Managed Care – PPO | Admitting: Nurse Practitioner

## 2021-10-14 ENCOUNTER — Encounter: Payer: Self-pay | Admitting: Nurse Practitioner

## 2021-10-14 VITALS — BP 134/69 | HR 83 | Temp 98.6°F

## 2021-10-14 DIAGNOSIS — F339 Major depressive disorder, recurrent, unspecified: Secondary | ICD-10-CM | POA: Diagnosis not present

## 2021-10-14 DIAGNOSIS — F900 Attention-deficit hyperactivity disorder, predominantly inattentive type: Secondary | ICD-10-CM | POA: Diagnosis not present

## 2021-10-14 DIAGNOSIS — S82891D Other fracture of right lower leg, subsequent encounter for closed fracture with routine healing: Secondary | ICD-10-CM

## 2021-10-14 DIAGNOSIS — L299 Pruritus, unspecified: Secondary | ICD-10-CM | POA: Diagnosis not present

## 2021-10-14 MED ORDER — DEXTROAMPHETAMINE SULFATE 10 MG PO TABS
10.0000 mg | ORAL_TABLET | Freq: Every day | ORAL | 0 refills | Status: DC
Start: 2021-12-02 — End: 2022-01-18

## 2021-10-14 MED ORDER — DEXTROAMPHETAMINE SULFATE 10 MG PO TABS: 10.0000 mg | ORAL_TABLET | Freq: Every day | ORAL | 0 refills | Status: DC

## 2021-10-14 MED ORDER — AMPHETAMINE-DEXTROAMPHET ER 20 MG PO CP24: 20.0000 mg | ORAL_CAPSULE | Freq: Every day | ORAL | 0 refills | Status: DC

## 2021-10-14 MED ORDER — GABAPENTIN 600 MG PO TABS: ORAL_TABLET | ORAL | 4 refills | Status: DC

## 2021-10-14 NOTE — Assessment & Plan Note (Signed)
Chronic. Has has significant improved since starting the Gabapentin.  Will continue with medication.  Follow up in 3 months for reevaluation.  Will give shingles shot at next appt.  Call sooner if concerns arise.

## 2021-10-14 NOTE — Assessment & Plan Note (Signed)
Chronic.  Well controlled on current medication regimen of Adderall '20mg'$  XR and Dextrostat 10 BID.  Refills sent for patient today.  PDMP checked.  Will follow up in 3 months.  Call sooner if concerns arise.

## 2021-10-14 NOTE — Patient Instructions (Signed)
Please call to schedule your mammogram and/or bone density: ?[facility]  ?Address: [address], [place], [place] [address] ?Phone: [phone]  ?

## 2021-10-14 NOTE — Assessment & Plan Note (Signed)
Chronic.  Controlled.  Continue with current medication regimen of Lexapro 20mg daily.  Refills sent today.   Return to clinic in 3 months for reevaluation.  Call sooner if concerns arise.   

## 2021-10-16 ENCOUNTER — Other Ambulatory Visit: Payer: Self-pay | Admitting: Nurse Practitioner

## 2021-10-16 DIAGNOSIS — Z1231 Encounter for screening mammogram for malignant neoplasm of breast: Secondary | ICD-10-CM

## 2021-10-19 ENCOUNTER — Ambulatory Visit
Admission: RE | Admit: 2021-10-19 | Discharge: 2021-10-19 | Disposition: A | Discharge status: Home / Self Care | Payer: BC Managed Care – PPO | Source: Ambulatory Visit | Attending: Nurse Practitioner | Admitting: Nurse Practitioner

## 2021-10-19 DIAGNOSIS — M8589 Other specified disorders of bone density and structure, multiple sites: Secondary | ICD-10-CM | POA: Diagnosis not present

## 2021-10-19 DIAGNOSIS — S82891D Other fracture of right lower leg, subsequent encounter for closed fracture with routine healing: Secondary | ICD-10-CM | POA: Diagnosis not present

## 2021-10-20 NOTE — Progress Notes (Signed)
Please let patient know that her bone density shows that she has osteopenia which means she does not have osteoporosis but her bones are not as strong as they could be.  I recommend she start a vitamin D with calcium supplement and after she gets her cast off start weight bearing exercises 5x weekly for at least 30 minutes.

## 2021-10-22 ENCOUNTER — Ambulatory Visit: Payer: BC Managed Care – PPO | Admitting: Nurse Practitioner

## 2021-12-22 ENCOUNTER — Ambulatory Visit
Admission: RE | Admit: 2021-12-22 | Discharge: 2021-12-22 | Disposition: A | Discharge status: Home / Self Care | Payer: BC Managed Care – PPO | Source: Ambulatory Visit | Attending: Nurse Practitioner | Admitting: Nurse Practitioner

## 2021-12-22 DIAGNOSIS — Z1231 Encounter for screening mammogram for malignant neoplasm of breast: Secondary | ICD-10-CM | POA: Insufficient documentation

## 2021-12-24 NOTE — Progress Notes (Signed)
Please let patient know her Mammogram did not show any evidence of a malignancy.  The recommendation is to repeat the Mammogram in 1 year.  

## 2021-12-30 ENCOUNTER — Telehealth: Payer: Self-pay | Admitting: Nurse Practitioner

## 2021-12-30 DIAGNOSIS — Z1231 Encounter for screening mammogram for malignant neoplasm of breast: Secondary | ICD-10-CM

## 2021-12-30 DIAGNOSIS — Z681 Body mass index (BMI) 19 or less, adult: Secondary | ICD-10-CM | POA: Diagnosis not present

## 2021-12-30 DIAGNOSIS — Z0000 Encounter for general adult medical examination without abnormal findings: Secondary | ICD-10-CM | POA: Diagnosis not present

## 2021-12-30 DIAGNOSIS — Z01419 Encounter for gynecological examination (general) (routine) without abnormal findings: Secondary | ICD-10-CM | POA: Diagnosis not present

## 2021-12-30 NOTE — Telephone Encounter (Signed)
Order placed for mammogram.

## 2021-12-30 NOTE — Telephone Encounter (Signed)
-----   Message from Simeon Craft, [place] sent at 12/30/2021 10:14 AM EDT ----- Can you place the order in please?  ----- Message ----- From: Jon Billings, NP Sent: 12/24/2021   7:47 AM EDT To: Simeon Craft, CMA  Can we call and make her 1 year Mammogram appt?  Santiago Glad

## 2022-01-14 ENCOUNTER — Ambulatory Visit: Payer: BC Managed Care – PPO | Admitting: Nurse Practitioner

## 2022-01-15 NOTE — Progress Notes (Signed)
BP 131/78   Pulse 67   Temp 98.4 F (36.9 C) (Oral)   Wt 122 lb 14.4 oz (55.7 kg)   LMP 10/25/2011   SpO2 98%   BMI 19.07 kg/m    Subjective:    Patient ID: Chelsea Velez, female    DOB: April 14, 1965, 56 y.o.   MRN: [id number]  HPI: Chelsea Velez is a 56 y.o. female  Chief Complaint  Patient presents with   ADHD    Patient would like to discuss medication changes for Lexapro- she states she woke up one morning and was having suicidal thoughts. Pt has no stopped lexapro since that incident.     DEPRESSION Patient states she stopped taking the Lexapro.  She is feeling better off the medication.  She feels overwhelmed with the medical bills she is having to pay.  Was having SI with the Lexapro.  Stopped taking the medication. Not having SI any longer.   [place] Office Visit from 01/18/2022 in [place]  PHQ-9 Total Score 15         01/18/2022   10:48 AM 10/14/2021   10:27 AM 07/13/2021    4:52 PM 06/11/2021    4:02 PM  GAD 7 : Generalized Anxiety Score  Nervous, Anxious, on Edge 0 0 0 2  Control/stop worrying 0 0 0 0  Worry too much - different things 0 0 0 0  Trouble relaxing 0 0 1 0  Restless 0 1 1 0  Easily annoyed or irritable 0 '1 1 2  '$ Afraid - awful might happen 0 0 0 0  Total GAD 7 Score 0 '2 3 4  '$ Anxiety Difficulty Not difficult at all Somewhat difficult Not difficult at all Not difficult at all    ADHD FOLLOW UP Patient states the pharmacy isn't able to handle the two doses. She is only taking XR Adderrall.  Would like to change to do two extended release daily.  ADHD status: controlled Satisfied with current therapy: yes Medication compliance:  excellent compliance Controlled substance contract: yes Previous psychiatry evaluation: yes Previous medications: yes adderall XR   Taking meds on weekends/vacations: yes Work/school performance:  excellent Difficulty sustaining attention/completing tasks: no Distracted by extraneous stimuli:  no Does not listen when spoken to: no  Fidgets with hands or feet: no Unable to stay in seat: no Blurts out/interrupts others: no ADHD Medication Side Effects: no    Decreased appetite: no    Headache: no    Sleeping disturbance pattern: no    Irritability: no    Rebound effects (worse than baseline) off medication: no    Anxiousness: no    Dizziness: no    Tics: no   Relevant past medical, surgical, family and social history reviewed and updated as indicated. Interim medical history since our last visit reviewed. Allergies and medications reviewed and updated.  Review of Systems  Psychiatric/Behavioral:  Positive for dysphoric mood. Negative for suicidal ideas. The patient is nervous/anxious.     Per HPI unless specifically indicated above     Objective:    BP 131/78   Pulse 67   Temp 98.4 F (36.9 C) (Oral)   Wt 122 lb 14.4 oz (55.7 kg)   LMP 10/25/2011   SpO2 98%   BMI 19.07 kg/m   Wt Readings from Last 3 Encounters:  01/18/22 122 lb 14.4 oz (55.7 kg)  09/23/21 123 lb (55.8 kg)  07/13/21 121 lb 3.2 oz (55 kg)    Physical Exam  Vitals and nursing note reviewed.  Constitutional:      General: She is not in acute distress.    Appearance: Normal appearance. She is normal weight. She is not ill-appearing, toxic-appearing or diaphoretic.  HENT:     Head: Normocephalic.     Right Ear: External ear normal.     Left Ear: External ear normal.     Nose: Nose normal.     Mouth/Throat:     Mouth: Mucous membranes are moist.     Pharynx: Oropharynx is clear.  Eyes:     General:        Right eye: No discharge.        Left eye: No discharge.     Extraocular Movements: Extraocular movements intact.     Conjunctiva/sclera: Conjunctivae normal.     Pupils: Pupils are equal, round, and reactive to light.  Cardiovascular:     Rate and Rhythm: Normal rate and regular rhythm.     Heart sounds: No murmur heard. Pulmonary:     Effort: Pulmonary effort is normal. No  respiratory distress.     Breath sounds: Normal breath sounds. No wheezing or rales.  Musculoskeletal:     Cervical back: Normal range of motion and neck supple.  Skin:    General: Skin is warm and dry.     Capillary Refill: Capillary refill takes less than 2 seconds.  Neurological:     General: No focal deficit present.     Mental Status: She is alert and oriented to person, place, and time. Mental status is at baseline.  Psychiatric:        Mood and Affect: Mood normal.        Behavior: Behavior normal.        Thought Content: Thought content normal.        Judgment: Judgment normal.     Results for orders placed or performed in visit on 06/11/21  Urine Culture   Specimen: Urine   UR  Result Value Ref Range   Urine Culture, Routine Final report    Organism ID, Bacteria No growth   Urinalysis, Routine w reflex microscopic  Result Value Ref Range   Specific Gravity, UA >1.030 (H) 1.005 - 1.030   pH, UA 5.5 5.0 - 7.5   Color, UA Yellow Yellow   Appearance Ur Clear Clear   Leukocytes,UA Negative Negative   Protein,UA Negative Negative/Trace   Glucose, UA Negative Negative   Ketones, UA Negative Negative   RBC, UA Negative Negative   Bilirubin, UA Negative Negative   Urobilinogen, Ur 0.2 0.2 - 1.0 mg/dL   Nitrite, UA Negative Negative      Assessment & Plan:   Problem List Items Addressed This Visit       Other   Attention deficit hyperactivity disorder (ADHD), predominantly inattentive type    Chronic.  Well controlled on current medication regimen of Adderall '20mg'$  XR BID.  Will change to BID to help with pharmacy confusion.  Refills sent for patient today.  PDMP checked.  Will follow up in 3 months.  Call sooner if concerns arise.       Relevant Medications   amphetamine-dextroamphetamine (ADDERALL XR) 20 MG 24 hr capsule   Depression, recurrent (HCC) - Primary    Chronic. Not well controlled. Stopped taking her Lexapro.  Has an appt with psychiatry this  afternoon.  Follow up in 3 months.  Call sooner if concerns arise.         Follow up plan:  Return in about 3 months (around 04/20/2022) for ADHD FU.

## 2022-01-18 ENCOUNTER — Ambulatory Visit: Payer: BC Managed Care – PPO | Admitting: Nurse Practitioner

## 2022-01-18 ENCOUNTER — Telehealth: Payer: Self-pay

## 2022-01-18 ENCOUNTER — Encounter: Payer: Self-pay | Admitting: Nurse Practitioner

## 2022-01-18 VITALS — BP 131/78 | HR 67 | Temp 98.4°F | Wt 122.9 lb

## 2022-01-18 DIAGNOSIS — F339 Major depressive disorder, recurrent, unspecified: Secondary | ICD-10-CM

## 2022-01-18 DIAGNOSIS — F900 Attention-deficit hyperactivity disorder, predominantly inattentive type: Secondary | ICD-10-CM

## 2022-01-18 MED ORDER — GABAPENTIN 600 MG PO TABS: ORAL_TABLET | ORAL | 1 refills | Status: DC

## 2022-01-18 MED ORDER — AMPHETAMINE-DEXTROAMPHET ER 20 MG PO CP24: 20.0000 mg | ORAL_CAPSULE | Freq: Two times a day (BID) | ORAL | 0 refills | Status: DC

## 2022-01-18 MED ORDER — AMPHETAMINE-DEXTROAMPHET ER 20 MG PO CP24: 20.0000 mg | ORAL_CAPSULE | Freq: Every day | ORAL | 0 refills | Status: DC

## 2022-01-18 NOTE — Telephone Encounter (Signed)
Pharmacy faxed over stating insurance will only cover medication for 1 x daily. Please advise.

## 2022-01-18 NOTE — Assessment & Plan Note (Signed)
Chronic. Not well controlled. Stopped taking her Lexapro.  Has an appt with psychiatry this afternoon.  Follow up in 3 months.  Call sooner if concerns arise.

## 2022-01-18 NOTE — Assessment & Plan Note (Signed)
Chronic.  Well controlled on current medication regimen of Adderall '20mg'$  XR BID.  Will change to BID to help with pharmacy confusion.  Refills sent for patient today.  PDMP checked.  Will follow up in 3 months.  Call sooner if concerns arise.

## 2022-01-18 NOTE — Addendum Note (Signed)
Addended by: Jon Billings on: 01/18/2022 11:27 AM   Modules accepted: Orders

## 2022-01-19 ENCOUNTER — Encounter: Payer: Self-pay | Admitting: Nurse Practitioner

## 2022-01-19 ENCOUNTER — Ambulatory Visit: Payer: Self-pay

## 2022-01-19 NOTE — Telephone Encounter (Signed)
Please let patient know that her insurance company only covers 1 x daily.

## 2022-01-19 NOTE — Telephone Encounter (Signed)
Reached out to patient via mychart.

## 2022-01-19 NOTE — Telephone Encounter (Signed)
  Chief Complaint: Missing medication Symptoms: Tearful - upset Frequency: today Pertinent Negatives: Patient denies  Disposition: '[]'$ ED /'[]'$ Urgent Care (no appt availability in office) / '[]'$ Appointment(In office/virtual)/ '[]'$  [facility]/ '[]'$ Home Care/ '[]'$ Refused Recommended Disposition /'[]'$  Mobile Bus/ X Follow-up with PCP Additional Notes:    Call from [place] from [place] and pt. Pt is very upset and tearful. Pt's medication is wrong and was denied by insurance.   Pt does not do well on the "regular" Adderal, Dextroamphet. The regular Adderall Hits her too hard, and upsets her stomach.  It is too much too fast.   PT needs to have the extended release form of the medication.  Rx was sent in for pt for the regular Adderall yesterday and was denied by insurance, because the rx was for 2 doses per day. This medication needs a pre-authorization to increase dose. ( Because the Rx  was denied pt has no medication at all. PT is very tearful, and upset.)  [employer] has a Urgent review number for pre-authorizations, that would need to be called for pt to get meds today.   That phone number is   [phone].  Pt needs Adderall ER 1 tablet twice daily.   Also gave pt the number for [facility].    Reason for Disposition  [1] Prescription refill request for ESSENTIAL medicine (i.e., likelihood of harm to patient if not taken) AND [2] triager unable to refill per department policy  Answer Assessment - Initial Assessment Questions 1. DRUG NAME: "What medicine do you need to have refilled?"     Adderall 2. REFILLS REMAINING: "How many refills are remaining?" (Note: The label on the medicine or pill bottle will show how many refills are remaining. If there are no refills remaining, then a renewal may be needed.)     None  3. EXPIRATION DATE: "What is the expiration date?" (Note: The label states when the prescription will expire, and thus can no longer be refilled.)      4. PRESCRIBING  HCP: "Who prescribed it?" Reason: If prescribed by specialist, call should be referred to that group.     Jon Billings 5. SYMPTOMS: "Do you have any symptoms?"      6. PREGNANCY: "Is there any chance that you are pregnant?" "When was your last menstrual period?"  Protocols used: Medication Refill and Renewal Call-A-AH

## 2022-01-20 ENCOUNTER — Telehealth: Payer: Self-pay | Admitting: Nurse Practitioner

## 2022-01-20 DIAGNOSIS — F900 Attention-deficit hyperactivity disorder, predominantly inattentive type: Secondary | ICD-10-CM

## 2022-01-20 MED ORDER — AMPHETAMINE-DEXTROAMPHET ER 20 MG PO CP24: 20.0000 mg | ORAL_CAPSULE | Freq: Every day | ORAL | 0 refills | Status: DC

## 2022-01-20 NOTE — Telephone Encounter (Signed)
See mychart messages for detailed information

## 2022-01-20 NOTE — Telephone Encounter (Addendum)
Pt returning Chelsea Velez's call about urgent PA / please advise  Pt may not be able to answer and asked if a detailed message can be left

## 2022-01-20 NOTE — Telephone Encounter (Signed)
Please see the message below to do the urgent PA phone number.  Please let them know that 1 per day does not last her all day.  It wears off too quickly.  Regular Adderall upsets her stomach which is why she takes the extended release.

## 2022-01-20 NOTE — Telephone Encounter (Signed)
Attempted to call patient to update her about the urgent PA request we have sent over for review, [place].

## 2022-01-20 NOTE — Telephone Encounter (Signed)
Spoke with patient regarding Adderall rx, K. Mathis Dad has also reached out to her via [employer], sending her a #15 supply until we get a determination from covermymeds.

## 2022-01-20 NOTE — Telephone Encounter (Signed)
PA for Adderall 20 MG ER has been initiated via covermymeds, awaiting determination- this was sent as an urgent request. Called number below in messages but representative stated they are not able to conduct a PA over the phone.   (Key: [id number]

## 2022-01-22 NOTE — Telephone Encounter (Signed)
Entered in error

## 2022-04-26 ENCOUNTER — Encounter: Payer: Self-pay | Admitting: Nurse Practitioner

## 2022-04-26 ENCOUNTER — Ambulatory Visit: Payer: BC Managed Care – PPO | Admitting: Nurse Practitioner

## 2022-04-26 VITALS — BP 112/66 | HR 71 | Temp 98.1°F | Wt 119.9 lb

## 2022-04-26 DIAGNOSIS — F900 Attention-deficit hyperactivity disorder, predominantly inattentive type: Secondary | ICD-10-CM

## 2022-04-26 DIAGNOSIS — Z23 Encounter for immunization: Secondary | ICD-10-CM

## 2022-04-26 DIAGNOSIS — F339 Major depressive disorder, recurrent, unspecified: Secondary | ICD-10-CM | POA: Diagnosis not present

## 2022-04-26 MED ORDER — AMPHETAMINE-DEXTROAMPHET ER 20 MG PO CP24: 20.0000 mg | ORAL_CAPSULE | Freq: Two times a day (BID) | ORAL | 0 refills | Status: DC

## 2022-04-26 MED ORDER — VALACYCLOVIR HCL 500 MG PO TABS: 500.0000 mg | ORAL_TABLET | Freq: Every day | ORAL | 1 refills | Status: DC

## 2022-04-26 MED ORDER — SERTRALINE HCL 50 MG PO TABS: 50.0000 mg | ORAL_TABLET | Freq: Every day | ORAL | 1 refills | Status: DC

## 2022-04-26 MED ORDER — GABAPENTIN 600 MG PO TABS: ORAL_TABLET | ORAL | 1 refills | Status: DC

## 2022-04-26 NOTE — Assessment & Plan Note (Signed)
Chronic.  Controlled.  Continue with current medication regimen of Adderall '20mg'$  XR BID.  Refills sent today.  Up to date on UDS and controlled substance agreement.  Will need updated at next visit.  Return to clinic in 3 months for reevaluation.  Call sooner if concerns arise.

## 2022-04-26 NOTE — Progress Notes (Signed)
BP 112/66   Pulse 71   Temp 98.1 F (36.7 C) (Oral)   Wt 119 lb 14.4 oz (54.4 kg)   LMP 10/25/2011   SpO2 99%   BMI 18.60 kg/m    Subjective:    Patient ID: Chelsea Velez, female    DOB: Apr 06, 1966, 57 y.o.   MRN: [id number]  HPI: Chelsea Velez is a 57 y.o. female  Chief Complaint  Patient presents with   ADHD   Depression    DEPRESSION Patient states she restarted Zoloft that she had at home.  She notices a difference when she doesn't take it.  She still has a lot of work stress. She was seeing a therapist but then they aren't in network and she can't find one in the area.  She is doing a lot of self care at home.     [place] Office Visit from 04/26/2022 in [place]  PHQ-9 Total Score 11         04/26/2022   10:30 AM 01/18/2022   10:48 AM 10/14/2021   10:27 AM 07/13/2021    4:52 PM  GAD 7 : Generalized Anxiety Score  Nervous, Anxious, on Edge 0 0 0 0  Control/stop worrying 0 0 0 0  Worry too much - different things 0 0 0 0  Trouble relaxing 2 0 0 1  Restless 3 0 1 1  Easily annoyed or irritable 2 0 1 1  Afraid - awful might happen 0 0 0 0  Total GAD 7 Score 7 0 2 3  Anxiety Difficulty Somewhat difficult Not difficult at all Somewhat difficult Not difficult at all    ADHD FOLLOW UP Patient states she is doing well with Adderall XR '20mg'$ .    ADHD status: controlled Satisfied with current therapy: yes Medication compliance:  excellent compliance Controlled substance contract: yes Previous psychiatry evaluation: yes Previous medications: yes adderall XR   Taking meds on weekends/vacations: yes Work/school performance:  excellent Difficulty sustaining attention/completing tasks: no Distracted by extraneous stimuli: no Does not listen when spoken to: no  Fidgets with hands or feet: no Unable to stay in seat: no Blurts out/interrupts others: no ADHD Medication Side Effects: no    Decreased appetite: no    Headache: no    Sleeping  disturbance pattern: no    Irritability: no    Rebound effects (worse than baseline) off medication: no    Anxiousness: no    Dizziness: no    Tics: no   Relevant past medical, surgical, family and social history reviewed and updated as indicated. Interim medical history since our last visit reviewed. Allergies and medications reviewed and updated.  Review of Systems  Psychiatric/Behavioral:  Positive for decreased concentration and dysphoric mood. Negative for suicidal ideas. The patient is nervous/anxious.     Per HPI unless specifically indicated above     Objective:    BP 112/66   Pulse 71   Temp 98.1 F (36.7 C) (Oral)   Wt 119 lb 14.4 oz (54.4 kg)   LMP 10/25/2011   SpO2 99%   BMI 18.60 kg/m   Wt Readings from Last 3 Encounters:  04/26/22 119 lb 14.4 oz (54.4 kg)  01/18/22 122 lb 14.4 oz (55.7 kg)  09/23/21 123 lb (55.8 kg)    Physical Exam Vitals and nursing note reviewed.  Constitutional:      General: She is not in acute distress.    Appearance: Normal appearance. She is normal weight. She  is not ill-appearing, toxic-appearing or diaphoretic.  HENT:     Head: Normocephalic.     Right Ear: External ear normal.     Left Ear: External ear normal.     Nose: Nose normal.     Mouth/Throat:     Mouth: Mucous membranes are moist.     Pharynx: Oropharynx is clear.  Eyes:     General:        Right eye: No discharge.        Left eye: No discharge.     Extraocular Movements: Extraocular movements intact.     Conjunctiva/sclera: Conjunctivae normal.     Pupils: Pupils are equal, round, and reactive to light.  Cardiovascular:     Rate and Rhythm: Normal rate and regular rhythm.     Heart sounds: No murmur heard. Pulmonary:     Effort: Pulmonary effort is normal. No respiratory distress.     Breath sounds: Normal breath sounds. No wheezing or rales.  Musculoskeletal:     Cervical back: Normal range of motion and neck supple.  Skin:    General: Skin is warm and  dry.     Capillary Refill: Capillary refill takes less than 2 seconds.  Neurological:     General: No focal deficit present.     Mental Status: She is alert and oriented to person, place, and time. Mental status is at baseline.  Psychiatric:        Mood and Affect: Mood normal.        Behavior: Behavior normal.        Thought Content: Thought content normal.        Judgment: Judgment normal.     Results for orders placed or performed in visit on 06/11/21  Urine Culture   Specimen: Urine   UR  Result Value Ref Range   Urine Culture, Routine Final report    Organism ID, Bacteria No growth   Urinalysis, Routine w reflex microscopic  Result Value Ref Range   Specific Gravity, UA >1.030 (H) 1.005 - 1.030   pH, UA 5.5 5.0 - 7.5   Color, UA Yellow Yellow   Appearance Ur Clear Clear   Leukocytes,UA Negative Negative   Protein,UA Negative Negative/Trace   Glucose, UA Negative Negative   Ketones, UA Negative Negative   RBC, UA Negative Negative   Bilirubin, UA Negative Negative   Urobilinogen, Ur 0.2 0.2 - 1.0 mg/dL   Nitrite, UA Negative Negative      Assessment & Plan:   Problem List Items Addressed This Visit       Other   Attention deficit hyperactivity disorder (ADHD), predominantly inattentive type    Chronic.  Controlled.  Continue with current medication regimen of Adderall '20mg'$  XR BID.  Refills sent today.  Up to date on UDS and controlled substance agreement.  Will need updated at next visit.  Return to clinic in 3 months for reevaluation.  Call sooner if concerns arise.        Relevant Medications   amphetamine-dextroamphetamine (ADDERALL XR) 20 MG 24 hr capsule (Start on 05/27/2022)   Depression, recurrent ([place]) - Primary    Chronic.  Controlled.  Continue with current medication regimen of Zoloft '50mg'$  daily.  Refills sent today.  Return to clinic in 3 months for reevaluation.  Call sooner if concerns arise.        Relevant Medications   sertraline (ZOLOFT) 50  MG tablet   Other Visit Diagnoses     Need for shingles  vaccine       Relevant Orders   Zoster Recombinant (Shingrix )        Follow up plan: Return in about 3 months (around 07/26/2022) for ADHD FU.

## 2022-04-26 NOTE — Assessment & Plan Note (Signed)
Chronic.  Controlled.  Continue with current medication regimen of Zoloft '50mg'$  daily.  Refills sent today.  Return to clinic in 3 months for reevaluation.  Call sooner if concerns arise.

## 2022-07-30 ENCOUNTER — Encounter: Payer: Self-pay | Admitting: Nurse Practitioner

## 2022-07-30 ENCOUNTER — Ambulatory Visit: Payer: BC Managed Care – PPO | Admitting: Nurse Practitioner

## 2022-07-30 VITALS — BP 164/88 | HR 94 | Temp 98.3°F | Wt 122.1 lb

## 2022-07-30 DIAGNOSIS — Z23 Encounter for immunization: Secondary | ICD-10-CM | POA: Diagnosis not present

## 2022-07-30 DIAGNOSIS — L299 Pruritus, unspecified: Secondary | ICD-10-CM | POA: Diagnosis not present

## 2022-07-30 DIAGNOSIS — F900 Attention-deficit hyperactivity disorder, predominantly inattentive type: Secondary | ICD-10-CM

## 2022-07-30 DIAGNOSIS — F339 Major depressive disorder, recurrent, unspecified: Secondary | ICD-10-CM | POA: Diagnosis not present

## 2022-07-30 MED ORDER — AMPHETAMINE-DEXTROAMPHET ER 20 MG PO CP24: 20.0000 mg | ORAL_CAPSULE | Freq: Two times a day (BID) | ORAL | 0 refills | Status: DC

## 2022-07-30 MED ORDER — GABAPENTIN 600 MG PO TABS: ORAL_TABLET | ORAL | 1 refills | Status: DC

## 2022-07-30 MED ORDER — SERTRALINE HCL 100 MG PO TABS: 50.0000 mg | ORAL_TABLET | Freq: Every day | ORAL | 1 refills | Status: DC

## 2022-07-30 NOTE — Assessment & Plan Note (Signed)
Chronic. Not well controlled.  Will increase Zoloft to  daily.  Refills sent today.  Follow up in 1 month.  Call sooner if concerns arise.

## 2022-07-30 NOTE — Assessment & Plan Note (Addendum)
Chronic.  Controlled.  Continue with current medication regimen of Adderall  XR BID.  Refills sent today.  Needs updated UDS and controlled substance agreement.  Will need updated at next visit.  Will hold off on changing dose due to changing other medication doses.  Return to clinic in 1 month for reevaluation.  Call sooner if concerns arise.

## 2022-07-30 NOTE — Assessment & Plan Note (Signed)
Chronic. Not well controlled.  Will restart Gabapentin.  Refills sent today.  Follow up in 1 month.  Call sooner if concerns arise.

## 2022-07-30 NOTE — Progress Notes (Signed)
BP (!) 164/88   Pulse 94   Temp 98.3 F (36.8 C) (Oral)   Wt 122 lb 1.6 oz (55.4 kg)   LMP 10/25/2011   SpO2 97%   BMI 18.94 kg/m    Subjective:    Patient ID: Chelsea Velez, female    DOB: Jan 23, 1966, 57 y.o.   MRN: [id number]  HPI: Chelsea Velez is a 57 y.o. female  Chief Complaint  Patient presents with   Anxiety   Depression   ADHD    DEPRESSION Patient states she has been taking two of her Zoloft due to her depression.  She has been out of her gabapentin and something touches her she is very sensitive and itching all over.    Flowsheet Row Office Visit from 07/30/2022 in [facility]  PHQ-9 Total Score 12         07/30/2022   10:04 AM 04/26/2022   10:30 AM 01/18/2022   10:48 AM 10/14/2021   10:27 AM  GAD 7 : Generalized Anxiety Score  Nervous, Anxious, on Edge 0 0 0 0  Control/stop worrying 0 0 0 0  Worry too much - different things 0 0 0 0  Trouble relaxing 3 2 0 0  Restless 1 3 0 1  Easily annoyed or irritable 1 2 0 1  Afraid - awful might happen 0 0 0 0  Total GAD 7 Score 5 7 0 2  Anxiety Difficulty Very difficult Somewhat difficult Not difficult at all Somewhat difficult    ADHD FOLLOW UP Patient states she has been off her Adderall for a couple of days.  States she would like to decrease her dose. Her goal is to get off the medication altogether. ADHD status: controlled Satisfied with current therapy: yes Medication compliance:  excellent compliance Controlled substance contract: yes Previous psychiatry evaluation: yes Previous medications: yes adderall XR   Taking meds on weekends/vacations: yes Work/school performance:  excellent Difficulty sustaining attention/completing tasks: no Distracted by extraneous stimuli: no Does not listen when spoken to: no  Fidgets with hands or feet: no Unable to stay in seat: no Blurts out/interrupts others: no ADHD Medication Side Effects: no    Decreased appetite: no    Headache:  no    Sleeping disturbance pattern: no    Irritability: no    Rebound effects (worse than baseline) off medication: no    Anxiousness: no    Dizziness: no    Tics: no   Relevant past medical, surgical, family and social history reviewed and updated as indicated. Interim medical history since our last visit reviewed. Allergies and medications reviewed and updated.  Review of Systems  Psychiatric/Behavioral:  Positive for decreased concentration and dysphoric mood. Negative for suicidal ideas. The patient is nervous/anxious.     Per HPI unless specifically indicated above     Objective:    BP (!) 164/88   Pulse 94   Temp 98.3 F (36.8 C) (Oral)   Wt 122 lb 1.6 oz (55.4 kg)   LMP 10/25/2011   SpO2 97%   BMI 18.94 kg/m   Wt Readings from Last 3 Encounters:  07/30/22 122 lb 1.6 oz (55.4 kg)  04/26/22 119 lb 14.4 oz (54.4 kg)  01/18/22 122 lb 14.4 oz (55.7 kg)    Physical Exam Vitals and nursing note reviewed.  Constitutional:      General: She is not in acute distress.    Appearance: Normal appearance. She is normal weight. She is not  ill-appearing, toxic-appearing or diaphoretic.  HENT:     Head: Normocephalic.     Right Ear: External ear normal.     Left Ear: External ear normal.     Nose: Nose normal.     Mouth/Throat:     Mouth: Mucous membranes are moist.     Pharynx: Oropharynx is clear.  Eyes:     General:        Right eye: No discharge.        Left eye: No discharge.     Extraocular Movements: Extraocular movements intact.     Conjunctiva/sclera: Conjunctivae normal.     Pupils: Pupils are equal, round, and reactive to light.  Cardiovascular:     Rate and Rhythm: Normal rate and regular rhythm.     Heart sounds: No murmur heard. Pulmonary:     Effort: Pulmonary effort is normal. No respiratory distress.     Breath sounds: Normal breath sounds. No wheezing or rales.  Musculoskeletal:     Cervical back: Normal range of motion and neck supple.  Skin:     General: Skin is warm and dry.     Capillary Refill: Capillary refill takes less than 2 seconds.  Neurological:     General: No focal deficit present.     Mental Status: She is alert and oriented to person, place, and time. Mental status is at baseline.  Psychiatric:        Mood and Affect: Mood normal.        Behavior: Behavior normal.        Thought Content: Thought content normal.        Judgment: Judgment normal.     Results for orders placed or performed in visit on 06/11/21  Urine Culture   Specimen: Urine   UR  Result Value Ref Range   Urine Culture, Routine Final report    Organism ID, Bacteria No growth   Urinalysis, Routine w reflex microscopic  Result Value Ref Range   Specific Gravity, UA >1.030 (H) 1.005 - 1.030   pH, UA 5.5 5.0 - 7.5   Color, UA Yellow Yellow   Appearance Ur Clear Clear   Leukocytes,UA Negative Negative   Protein,UA Negative Negative/Trace   Glucose, UA Negative Negative   Ketones, UA Negative Negative   RBC, UA Negative Negative   Bilirubin, UA Negative Negative   Urobilinogen, Ur 0.2 0.2 - 1.0 mg/dL   Nitrite, UA Negative Negative      Assessment & Plan:   Problem List Items Addressed This Visit       Musculoskeletal and Integument   Pruritus    Chronic. Not well controlled.  Will restart Gabapentin.  Refills sent today.  Follow up in 1 month.  Call sooner if concerns arise.         Other   Attention deficit hyperactivity disorder (ADHD), predominantly inattentive type - Primary    Chronic.  Controlled.  Continue with current medication regimen of Adderall 20mg  XR BID.  Refills sent today.  Needs updated UDS and controlled substance agreement.  Will need updated at next visit.  Will hold off on changing dose due to changing other medication doses.  Return to clinic in 1 month for reevaluation.  Call sooner if concerns arise.        Depression, recurrent    Chronic. Not well controlled.  Will increase Zoloft to 100mg  daily.   Refills sent today.  Follow up in 1 month.  Call sooner if concerns arise.  Relevant Medications   sertraline (ZOLOFT) 100 MG tablet   Other Visit Diagnoses     Need for shingles vaccine       Relevant Orders   Zoster Recombinant (Shingrix )        Follow up plan: Return in about 1 month (around 08/29/2022) for Medication Management.

## 2022-08-30 ENCOUNTER — Ambulatory Visit: Payer: BC Managed Care – PPO | Admitting: Nurse Practitioner

## 2022-08-30 ENCOUNTER — Encounter: Payer: Self-pay | Admitting: Nurse Practitioner

## 2022-08-30 VITALS — BP 120/75 | HR 75 | Temp 98.3°F | Wt 121.0 lb

## 2022-08-30 DIAGNOSIS — F900 Attention-deficit hyperactivity disorder, predominantly inattentive type: Secondary | ICD-10-CM

## 2022-08-30 DIAGNOSIS — L299 Pruritus, unspecified: Secondary | ICD-10-CM | POA: Diagnosis not present

## 2022-08-30 DIAGNOSIS — F339 Major depressive disorder, recurrent, unspecified: Secondary | ICD-10-CM

## 2022-08-30 MED ORDER — AMPHETAMINE-DEXTROAMPHET ER 10 MG PO CP24: 10.0000 mg | ORAL_CAPSULE | Freq: Two times a day (BID) | ORAL | 0 refills | Status: DC

## 2022-08-30 MED ORDER — SERTRALINE HCL 100 MG PO TABS: 100.0000 mg | ORAL_TABLET | Freq: Every day | ORAL | 1 refills | Status: DC

## 2022-08-30 MED ORDER — GABAPENTIN 600 MG PO TABS: 600.0000 mg | ORAL_TABLET | Freq: Two times a day (BID) | ORAL | 1 refills | Status: DC

## 2022-08-30 NOTE — Progress Notes (Signed)
BP 120/75   Pulse 75   Temp 98.3 F (36.8 C) (Oral)   Wt 121 lb (54.9 kg)   LMP 10/25/2011   SpO2 98%   BMI 18.77 kg/m    Subjective:    Patient ID: Chelsea Velez, female    DOB: [birth year], 57 y.o.   MRN: [id number]  HPI: Chelsea Velez is a 57 y.o. female  Chief Complaint  Patient presents with   ADHD    Pt states wants to go down on the adderall believes its causing damage to her teeth per dentist    DEPRESSION Patient states she has been taking two of her Zoloft due to her depression.  She feels like the the 100mg  is working well for her.  Would like to continue on the same dose.     Flowsheet Row Office Visit from 08/30/2022 in [facility]  PHQ-9 Total Score 9         08/30/2022    2:14 PM 07/30/2022   10:04 AM 04/26/2022   10:30 AM 01/18/2022   10:48 AM  GAD 7 : Generalized Anxiety Score  Nervous, Anxious, on Edge 0 0 0 0  Control/stop worrying 0 0 0 0  Worry too much - different things 0 0 0 0  Trouble relaxing 1 3 2  0  Restless 1 1 3  0  Easily annoyed or irritable 1 1 2  0  Afraid - awful might happen 0 0 0 0  Total GAD 7 Score 3 5 7  0  Anxiety Difficulty  Very difficult Somewhat difficult Not difficult at all    ADHD FOLLOW UP Patient states she has been off her Adderall for a couple of days.  States she would like to decrease her dose. Her goal is to get off the medication altogether.  She would like to decrease to Adderall 10mg  BID. ADHD status: controlled Satisfied with current therapy: yes Medication compliance:  excellent compliance Controlled substance contract: yes Previous psychiatry evaluation: yes Previous medications: yes adderall XR   Taking meds on weekends/vacations: yes Work/school performance:  excellent Difficulty sustaining attention/completing tasks: no Distracted by extraneous stimuli: no Does not listen when spoken to: no  Fidgets with hands or feet: no Unable to stay in seat: no Blurts  out/interrupts others: no ADHD Medication Side Effects: no    Decreased appetite: no    Headache: no    Sleeping disturbance pattern: no    Irritability: no    Rebound effects (worse than baseline) off medication: no    Anxiousness: no    Dizziness: no    Tics: no   Relevant past medical, surgical, family and social history reviewed and updated as indicated. Interim medical history since our last visit reviewed. Allergies and medications reviewed and updated.  Review of Systems  Psychiatric/Behavioral:  Positive for decreased concentration and dysphoric mood. Negative for suicidal ideas. The patient is nervous/anxious.     Per HPI unless specifically indicated above     Objective:    BP 120/75   Pulse 75   Temp 98.3 F (36.8 C) (Oral)   Wt 121 lb (54.9 kg)   LMP 10/25/2011   SpO2 98%   BMI 18.77 kg/m   Wt Readings from Last 3 Encounters:  08/30/22 121 lb (54.9 kg)  07/30/22 122 lb 1.6 oz (55.4 kg)  04/26/22 119 lb 14.4 oz (54.4 kg)    Physical Exam Vitals and nursing note reviewed.  Constitutional:      General:  She is not in acute distress.    Appearance: Normal appearance. She is normal weight. She is not ill-appearing, toxic-appearing or diaphoretic.  HENT:     Head: Normocephalic.     Right Ear: External ear normal.     Left Ear: External ear normal.     Nose: Nose normal.     Mouth/Throat:     Mouth: Mucous membranes are moist.     Pharynx: Oropharynx is clear.  Eyes:     General:        Right eye: No discharge.        Left eye: No discharge.     Extraocular Movements: Extraocular movements intact.     Conjunctiva/sclera: Conjunctivae normal.     Pupils: Pupils are equal, round, and reactive to light.  Cardiovascular:     Rate and Rhythm: Normal rate and regular rhythm.     Heart sounds: No murmur heard. Pulmonary:     Effort: Pulmonary effort is normal. No respiratory distress.     Breath sounds: Normal breath sounds. No wheezing or rales.   Musculoskeletal:     Cervical back: Normal range of motion and neck supple.  Skin:    General: Skin is warm and dry.     Capillary Refill: Capillary refill takes less than 2 seconds.  Neurological:     General: No focal deficit present.     Mental Status: She is alert and oriented to person, place, and time. Mental status is at baseline.  Psychiatric:        Mood and Affect: Mood normal.        Behavior: Behavior normal.        Thought Content: Thought content normal.        Judgment: Judgment normal.     Results for orders placed or performed in visit on 06/11/21  Urine Culture   Specimen: Urine   UR  Result Value Ref Range   Urine Culture, Routine Final report    Organism ID, Bacteria No growth   Urinalysis, Routine w reflex microscopic  Result Value Ref Range   Specific Gravity, UA >1.030 (H) 1.005 - 1.030   pH, UA 5.5 5.0 - 7.5   Color, UA Yellow Yellow   Appearance Ur Clear Clear   Leukocytes,UA Negative Negative   Protein,UA Negative Negative/Trace   Glucose, UA Negative Negative   Ketones, UA Negative Negative   RBC, UA Negative Negative   Bilirubin, UA Negative Negative   Urobilinogen, Ur 0.2 0.2 - 1.0 mg/dL   Nitrite, UA Negative Negative      Assessment & Plan:   Problem List Items Addressed This Visit       Musculoskeletal and Integument   Pruritus    Chronic.  Controlled.  Continue with current medication regimen of Gabapentin 600mg  BID.  Refills sent today.  Return to clinic in 6 months for reevaluation.  Call sooner if concerns arise.          Other   Attention deficit hyperactivity disorder (ADHD), predominantly inattentive type    Chronic.  Controlled.  Continue with current medication regimen of Adderall 10mg  XR BID.  Refills sent today.  Needs updated UDS and controlled substance agreement.  Will need updated at next visit.  Can increase back to Adderall 20mg  BID if needed. Return to clinic in 1 month for reevaluation.  Call sooner if concerns  arise.        Depression, recurrent (HCC) - Primary    Chronic.  Controlled.  Continue with current medication regimen of Zoloft 100mg .   Return to clinic in 6 months for reevaluation.  Call sooner if concerns arise.        Relevant Medications   sertraline (ZOLOFT) 100 MG tablet     Follow up plan: Return in about 1 month (around 09/30/2022) for ADHD FU.

## 2022-08-30 NOTE — Assessment & Plan Note (Addendum)
Chronic.  Controlled.  Continue with current medication regimen of Zoloft 100mg .   Return to clinic in 6 months for reevaluation.  Call sooner if concerns arise.

## 2022-08-30 NOTE — Assessment & Plan Note (Signed)
Chronic.  Controlled.  Continue with current medication regimen of Adderall 10mg  XR BID.  Refills sent today.  Needs updated UDS and controlled substance agreement.  Will need updated at next visit.  Can increase back to Adderall 20mg  BID if needed. Return to clinic in 1 month for reevaluation.  Call sooner if concerns arise.

## 2022-08-30 NOTE — Assessment & Plan Note (Signed)
Chronic.  Controlled.  Continue with current medication regimen of Gabapentin 600mg  BID.  Refills sent today.  Return to clinic in 6 months for reevaluation.  Call sooner if concerns arise.

## 2022-09-27 ENCOUNTER — Encounter: Payer: Self-pay | Admitting: Nurse Practitioner

## 2022-09-27 ENCOUNTER — Ambulatory Visit: Payer: BC Managed Care – PPO | Admitting: Nurse Practitioner

## 2022-09-27 VITALS — BP 128/70 | HR 62 | Temp 98.0°F | Wt 119.4 lb

## 2022-09-27 DIAGNOSIS — F900 Attention-deficit hyperactivity disorder, predominantly inattentive type: Secondary | ICD-10-CM | POA: Diagnosis not present

## 2022-09-27 DIAGNOSIS — F339 Major depressive disorder, recurrent, unspecified: Secondary | ICD-10-CM | POA: Diagnosis not present

## 2022-09-27 MED ORDER — AMPHETAMINE-DEXTROAMPHET ER 10 MG PO CP24: 10.0000 mg | ORAL_CAPSULE | Freq: Two times a day (BID) | ORAL | 0 refills | Status: DC

## 2022-09-27 MED ORDER — AMPHETAMINE-DEXTROAMPHET ER 10 MG PO CP24: 10.0000 mg | ORAL_CAPSULE | Freq: Every day | ORAL | 0 refills | Status: DC

## 2022-09-27 MED ORDER — SERTRALINE HCL 100 MG PO TABS: 200.0000 mg | ORAL_TABLET | Freq: Every day | ORAL | 1 refills | Status: DC

## 2022-09-27 NOTE — Assessment & Plan Note (Signed)
Chronic. Not well controlled.  Will increase Zoloft dose to 200mg  daily.  Follow up in 4 months.  Call sooner if concerns arise.

## 2022-09-27 NOTE — Assessment & Plan Note (Signed)
Chronic.  Controlled.  Continue with current medication regimen of Adderall 10mg  XR BID.  Refills sent today.  Needs updated UDS and controlled substance agreement.  Will need updated at next visit.  Doing well with Adderall 10mg  XR BID.  Return to clinic in 4 months for reevaluation.  Call sooner if concerns arise.

## 2022-09-27 NOTE — Progress Notes (Signed)
BP 128/70   Pulse 62   Temp 98 F (36.7 C) (Oral)   Wt 119 lb 6.4 oz (54.2 kg)   LMP 10/25/2011   SpO2 96%   BMI 18.52 kg/m    Subjective:    Patient ID: Chelsea Velez, female    DOB: Jan 31, 1966, 57 y.o.   MRN: [id number]  HPI: Chelsea Velez is a 57 y.o. female  Chief Complaint  Patient presents with   ADHD   DEPRESSION Patient states she has been taking Zoloft 100mg  daily.  It does work for her but she is really lonely and feels like the dose needs to be increased.  Denies SI.    Flowsheet Row Office Visit from 09/27/2022 in [facility]  PHQ-9 Total Score 9         09/27/2022   11:20 AM 08/30/2022    2:14 PM 07/30/2022   10:04 AM 04/26/2022   10:30 AM  GAD 7 : Generalized Anxiety Score  Nervous, Anxious, on Edge 0 0 0 0  Control/stop worrying 0 0 0 0  Worry too much - different things 0 0 0 0  Trouble relaxing 1 1 3 2   Restless 1 1 1 3   Easily annoyed or irritable 1 1 1 2   Afraid - awful might happen  0 0 0  Total GAD 7 Score  3 5 7   Anxiety Difficulty   Very difficult Somewhat difficult    ADHD FOLLOW UP Patient states she has been off her Adderall for a couple of days.  States she would like to decrease her dose. Her goal is to get off the medication altogether.  She would like to decrease to Adderall 10mg  BID. ADHD status: controlled Satisfied with current therapy: yes Medication compliance:  excellent compliance Controlled substance contract: yes Previous psychiatry evaluation: yes Previous medications: yes adderall XR   Taking meds on weekends/vacations: yes Work/school performance:  excellent Difficulty sustaining attention/completing tasks: no Distracted by extraneous stimuli: no Does not listen when spoken to: no  Fidgets with hands or feet: no Unable to stay in seat: no Blurts out/interrupts others: no ADHD Medication Side Effects: no    Decreased appetite: no    Headache: no    Sleeping disturbance pattern: no     Irritability: no    Rebound effects (worse than baseline) off medication: no    Anxiousness: no    Dizziness: no    Tics: no  Patient states she couldn't tolerate the valtrex everyday.  It was causing her to go to the bathroom constantly.    Relevant past medical, surgical, family and social history reviewed and updated as indicated. Interim medical history since our last visit reviewed. Allergies and medications reviewed and updated.  Review of Systems  Psychiatric/Behavioral:  Positive for decreased concentration and dysphoric mood. Negative for suicidal ideas. The patient is nervous/anxious.     Per HPI unless specifically indicated above     Objective:    BP 128/70   Pulse 62   Temp 98 F (36.7 C) (Oral)   Wt 119 lb 6.4 oz (54.2 kg)   LMP 10/25/2011   SpO2 96%   BMI 18.52 kg/m   Wt Readings from Last 3 Encounters:  09/27/22 119 lb 6.4 oz (54.2 kg)  08/30/22 121 lb (54.9 kg)  07/30/22 122 lb 1.6 oz (55.4 kg)    Physical Exam Vitals and nursing note reviewed.  Constitutional:      General: She is not in  acute distress.    Appearance: Normal appearance. She is normal weight. She is not ill-appearing, toxic-appearing or diaphoretic.  HENT:     Head: Normocephalic.     Right Ear: External ear normal.     Left Ear: External ear normal.     Nose: Nose normal.     Mouth/Throat:     Mouth: Mucous membranes are moist.     Pharynx: Oropharynx is clear.  Eyes:     General:        Right eye: No discharge.        Left eye: No discharge.     Extraocular Movements: Extraocular movements intact.     Conjunctiva/sclera: Conjunctivae normal.     Pupils: Pupils are equal, round, and reactive to light.  Cardiovascular:     Rate and Rhythm: Normal rate and regular rhythm.     Heart sounds: No murmur heard. Pulmonary:     Effort: Pulmonary effort is normal. No respiratory distress.     Breath sounds: Normal breath sounds. No wheezing or rales.  Musculoskeletal:     Cervical  back: Normal range of motion and neck supple.  Skin:    General: Skin is warm and dry.     Capillary Refill: Capillary refill takes less than 2 seconds.  Neurological:     General: No focal deficit present.     Mental Status: She is alert and oriented to person, place, and time. Mental status is at baseline.  Psychiatric:        Mood and Affect: Mood normal.        Behavior: Behavior normal.        Thought Content: Thought content normal.        Judgment: Judgment normal.     Results for orders placed or performed in visit on 06/11/21  Urine Culture   Specimen: Urine   UR  Result Value Ref Range   Urine Culture, Routine Final report    Organism ID, Bacteria No growth   Urinalysis, Routine w reflex microscopic  Result Value Ref Range   Specific Gravity, UA >1.030 (H) 1.005 - 1.030   pH, UA 5.5 5.0 - 7.5   Color, UA Yellow Yellow   Appearance Ur Clear Clear   Leukocytes,UA Negative Negative   Protein,UA Negative Negative/Trace   Glucose, UA Negative Negative   Ketones, UA Negative Negative   RBC, UA Negative Negative   Bilirubin, UA Negative Negative   Urobilinogen, Ur 0.2 0.2 - 1.0 mg/dL   Nitrite, UA Negative Negative      Assessment & Plan:   Problem List Items Addressed This Visit       Other   Attention deficit hyperactivity disorder (ADHD), predominantly inattentive type - Primary    Chronic.  Controlled.  Continue with current medication regimen of Adderall 10mg  XR BID.  Refills sent today.  Needs updated UDS and controlled substance agreement.  Will need updated at next visit.  Doing well with Adderall 10mg  XR BID.  Return to clinic in 4 months for reevaluation.  Call sooner if concerns arise.        Depression, recurrent (HCC)    Chronic. Not well controlled.  Will increase Zoloft dose to 200mg  daily.  Follow up in 4 months.  Call sooner if concerns arise.       Relevant Medications   sertraline (ZOLOFT) 100 MG tablet     Follow up plan: Return in  about 4 months (around 01/27/2023) for Depression/Anxiety FU.

## 2022-12-16 DIAGNOSIS — D225 Melanocytic nevi of trunk: Secondary | ICD-10-CM | POA: Diagnosis not present

## 2022-12-16 DIAGNOSIS — D485 Neoplasm of uncertain behavior of skin: Secondary | ICD-10-CM | POA: Diagnosis not present

## 2022-12-16 DIAGNOSIS — R208 Other disturbances of skin sensation: Secondary | ICD-10-CM | POA: Diagnosis not present

## 2022-12-16 DIAGNOSIS — L821 Other seborrheic keratosis: Secondary | ICD-10-CM | POA: Diagnosis not present

## 2022-12-16 DIAGNOSIS — E854 Organ-limited amyloidosis: Secondary | ICD-10-CM | POA: Diagnosis not present

## 2022-12-16 DIAGNOSIS — L981 Factitial dermatitis: Secondary | ICD-10-CM | POA: Diagnosis not present

## 2023-01-03 DIAGNOSIS — Z01419 Encounter for gynecological examination (general) (routine) without abnormal findings: Secondary | ICD-10-CM | POA: Diagnosis not present

## 2023-01-03 DIAGNOSIS — Z124 Encounter for screening for malignant neoplasm of cervix: Secondary | ICD-10-CM | POA: Diagnosis not present

## 2023-01-30 NOTE — Progress Notes (Unsigned)
LMP 10/25/2011    Subjective:    Patient ID: Chelsea Velez, female    DOB: Nov 15, 1965, 57 y.o.   MRN: [id number]  HPI: Chelsea Velez is a 57 y.o. female presenting on 01/31/2023 for comprehensive medical examination. Current medical complaints include:{Blank single:19197::"none","***"}  She currently lives with: Menopausal Symptoms: {Blank single:19197::"yes","no"}  DEPRESSION Patient states she has been taking Zoloft 100mg  daily.  It does work for her but she is really lonely and feels like the dose needs to be increased.  Denies SI.    Flowsheet Row Office Visit from 09/27/2022 in [facility]  PHQ-9 Total Score 9         09/27/2022   11:20 AM 08/30/2022    2:14 PM 07/30/2022   10:04 AM 04/26/2022   10:30 AM  GAD 7 : Generalized Anxiety Score  Nervous, Anxious, on Edge 0 0 0 0  Control/stop worrying 0 0 0 0  Worry too much - different things 0 0 0 0  Trouble relaxing 1 1 3 2   Restless 1 1 1 3   Easily annoyed or irritable 1 1 1 2   Afraid - awful might happen  0 0 0  Total GAD 7 Score  3 5 7   Anxiety Difficulty   Very difficult Somewhat difficult    ADHD FOLLOW UP Patient states she has been off her Adderall for a couple of days.  States she would like to decrease her dose. Her goal is to get off the medication altogether.  She would like to decrease to Adderall 10mg  BID. ADHD status: controlled Satisfied with current therapy: yes Medication compliance:  excellent compliance Controlled substance contract: yes Previous psychiatry evaluation: yes Previous medications: yes adderall XR   Taking meds on weekends/vacations: yes Work/school performance:  excellent Difficulty sustaining attention/completing tasks: no Distracted by extraneous stimuli: no Does not listen when spoken to: no  Fidgets with hands or feet: no Unable to stay in seat: no Blurts out/interrupts others: no ADHD Medication Side Effects: no    Decreased appetite: no     Headache: no    Sleeping disturbance pattern: no    Irritability: no    Rebound effects (worse than baseline) off medication: no    Anxiousness: no    Dizziness: no    Tics: no Depression Screen done today and results listed below:     09/27/2022   11:20 AM 08/30/2022    2:13 PM 07/30/2022   10:04 AM 04/26/2022   10:29 AM 01/18/2022   10:47 AM  Depression screen PHQ 2/9  Decreased Interest 2 3 3 1 3   Down, Depressed, Hopeless 2 3 3 3 3   PHQ - 2 Score 4 6 6 4 6   Altered sleeping 1 1 3 1 3   Tired, decreased energy 1 0 3 3 3   Change in appetite 0  0 0 1  Feeling bad or failure about yourself  0 1 0 0 0  Trouble concentrating 2 1 0 2 1  Moving slowly or fidgety/restless 1 0 0 0 0  Suicidal thoughts   0 1 1  PHQ-9 Score 9 9 12 11 15   Difficult doing work/chores   Very difficult Somewhat difficult Somewhat difficult    The patient {has/does not have:19849} a history of falls. I {did/did not:19850} complete a risk assessment for falls. A plan of care for falls {was/was not:19852} documented.   Past Medical History:  Past Medical History:  Diagnosis Date   ADHD  Anxiety    Arthritis    hands - tx with otc meds prn   Depression    Foot fracture, left     Surgical History:  Past Surgical History:  Procedure Laterality Date   BLADDER SUSPENSION  08/04/2011   Procedure: TRANSVAGINAL TAPE (TVT) PROCEDURE;  Surgeon: Loney Laurence, MD;  Location: [facility];  Service: Gynecology;  Laterality: N/A;   BLADDER SUSPENSION N/A    BREAST SURGERY     Right breast lumpectomy   CESAREAN SECTION  1995   COLONOSCOPY N/A 09/24/2020   Procedure: COLONOSCOPY;  Surgeon: Wyline Mood, MD;  Location: [facility];  Service: Gastroenterology;  Laterality: N/A;   CYSTOSCOPY  08/04/2011   Procedure: CYSTOSCOPY;  Surgeon: Loney Laurence, MD;  Location: [facility];  Service: Gynecology;  Laterality: N/A;   endosocpy     EYE SURGERY     [facility] Teeth Ext      Medications:  Current Outpatient  Medications on File Prior to Visit  Medication Sig   amphetamine-dextroamphetamine (ADDERALL XR) 10 MG 24 hr capsule Take 1 capsule (10 mg total) by mouth 2 (two) times daily.   amphetamine-dextroamphetamine (ADDERALL XR) 10 MG 24 hr capsule Take 1 capsule (10 mg total) by mouth daily.   amphetamine-dextroamphetamine (ADDERALL XR) 10 MG 24 hr capsule Take 1 capsule (10 mg total) by mouth daily.   amphetamine-dextroamphetamine (ADDERALL XR) 10 MG 24 hr capsule Take 1 capsule (10 mg total) by mouth daily.   gabapentin (NEURONTIN) 600 MG tablet Take 1 tablet (600 mg total) by mouth 2 (two) times daily.   sertraline (ZOLOFT) 100 MG tablet Take 2 tablets (200 mg total) by mouth daily.   valACYclovir (VALTREX) 500 MG tablet Take 1 tablet (500 mg total) by mouth daily. As needed (Patient not taking: Reported on 09/27/2022)   No current facility-administered medications on file prior to visit.    Allergies:  No Known Allergies  Social History:  Social History   Socioeconomic History   Marital status: Single    Spouse name: Not on file   Number of children: Not on file   Years of education: Not on file   Highest education level: Some college, no degree  Occupational History   Not on file  Tobacco Use   Smoking status: Former    Current packs/day: 0.00    Average packs/day: 0.3 packs/day for 8.0 years (2.0 ttl pk-yrs)    Types: Cigarettes    Start date: 09/10/1984    Quit date: 09/10/1992    Years since quitting: 30.4   Smokeless tobacco: Never  Vaping Use   Vaping status: Never Used  Substance and Sexual Activity   Alcohol use: Yes    Comment: socially   Drug use: Yes    Types: Marijuana, Amphetamines    Comment: takes Adderal   Sexual activity: Not Currently    Birth control/protection: None  Other Topics Concern   Not on file  Social History Narrative   Not on file   Social Determinants of Health   Financial Resource Strain: Medium Risk (09/26/2022)   Overall Financial  Resource Strain (CARDIA)    Difficulty of Paying Living Expenses: Somewhat hard  Food Insecurity: No Food Insecurity (09/26/2022)   Hunger Vital Sign    Worried About Running Out of Food in the Last Year: Never true    Ran Out of Food in the Last Year: Never true  Transportation Needs: No Transportation Needs (09/26/2022)   PRAPARE - Transportation  Lack of Transportation (Medical): No    Lack of Transportation (Non-Medical): No  Physical Activity: Sufficiently Active (09/26/2022)   Exercise Vital Sign    Days of Exercise per Week: 5 days    Minutes of Exercise per Session: 120 min  Stress: Stress Concern Present (09/26/2022)   [employer] of Occupational Health - Occupational Stress Questionnaire    Feeling of Stress : To some extent  Social Connections: Socially Isolated (09/26/2022)   Social Connection and Isolation Panel [NHANES]    Frequency of Communication with Friends and Family: More than three times a week    Frequency of Social Gatherings with Friends and Family: Once a week    Attends Religious Services: Never    Database administrator or Organizations: No    Attends Engineer, structural: Not on file    Marital Status: Never married  Catering manager Violence: Not on file   Social History   Tobacco Use  Smoking Status Former   Current packs/day: 0.00   Average packs/day: 0.3 packs/day for 8.0 years (2.0 ttl pk-yrs)   Types: Cigarettes   Start date: 09/10/1984   Quit date: 09/10/1992   Years since quitting: 30.4  Smokeless Tobacco Never   Social History   Substance and Sexual Activity  Alcohol Use Yes   Comment: socially    Family History:  Family History  Problem Relation Age of Onset   Cervical cancer Mother    Breast cancer Mother    Diabetes Mother    Hypertension Mother    Clotting disorder Mother    Breast cancer Maternal Aunt     Past medical history, surgical history, medications, allergies, family history and social history reviewed  with patient today and changes made to appropriate areas of the chart.   ROS All other ROS negative except what is listed above and in the HPI.      Objective:    LMP 10/25/2011   Wt Readings from Last 3 Encounters:  09/27/22 119 lb 6.4 oz (54.2 kg)  08/30/22 121 lb (54.9 kg)  07/30/22 122 lb 1.6 oz (55.4 kg)    Physical Exam  Results for orders placed or performed in visit on 06/11/21  Urine Culture   Specimen: Urine   UR  Result Value Ref Range   Urine Culture, Routine Final report    Organism ID, Bacteria No growth   Urinalysis, Routine w reflex microscopic  Result Value Ref Range   Specific Gravity, UA >1.030 (H) 1.005 - 1.030   pH, UA 5.5 5.0 - 7.5   Color, UA Yellow Yellow   Appearance Ur Clear Clear   Leukocytes,UA Negative Negative   Protein,UA Negative Negative/Trace   Glucose, UA Negative Negative   Ketones, UA Negative Negative   RBC, UA Negative Negative   Bilirubin, UA Negative Negative   Urobilinogen, Ur 0.2 0.2 - 1.0 mg/dL   Nitrite, UA Negative Negative      Assessment & Plan:   Problem List Items Addressed This Visit       Other   Attention deficit hyperactivity disorder (ADHD), predominantly inattentive type   Depression, recurrent (HCC) - Primary     Follow up plan: No follow-ups on file.   LABORATORY TESTING:  - Pap smear: {Blank single:19197::"pap done","not applicable","up to date","done elsewhere"}  IMMUNIZATIONS:   - Tdap: Tetanus vaccination status reviewed: {tetanus status:315746}. - Influenza: {Blank single:19197::"Up to date","Administered today","Postponed to flu season","Refused","Given elsewhere"} - Pneumovax: {Blank single:19197::"Up to date","Administered today","Not applicable","Refused","Given elsewhere"} - Prevnar: {  Blank single:19197::"Up to date","Administered today","Not applicable","Refused","Given elsewhere"} - COVID: {Blank single:19197::"Up to date","Administered today","Not applicable","Refused","Given  elsewhere"} - HPV: {Blank single:19197::"Up to date","Administered today","Not applicable","Refused","Given elsewhere"} - Shingrix vaccine: {Blank single:19197::"Up to date","Administered today","Not applicable","Refused","Given elsewhere"}  SCREENING: -Mammogram: {Blank single:19197::"Up to date","Ordered today","Not applicable","Refused","Done elsewhere"}  - Colonoscopy: {Blank single:19197::"Up to date","Ordered today","Not applicable","Refused","Done elsewhere"}  - Bone Density: {Blank single:19197::"Up to date","Ordered today","Not applicable","Refused","Done elsewhere"}  -Hearing Test: {Blank single:19197::"Up to date","Ordered today","Not applicable","Refused","Done elsewhere"}  -Spirometry: {Blank single:19197::"Up to date","Ordered today","Not applicable","Refused","Done elsewhere"}   PATIENT COUNSELING:   Advised to take 1 mg of folate supplement per day if capable of pregnancy.   Sexuality: Discussed sexually transmitted diseases, partner selection, use of condoms, avoidance of unintended pregnancy  and contraceptive alternatives.   Advised to avoid cigarette smoking.  I discussed with the patient that most people either abstain from alcohol or drink within safe limits (<=14/week and <=4 drinks/occasion for males, <=7/weeks and <= 3 drinks/occasion for females) and that the risk for alcohol disorders and other health effects rises proportionally with the number of drinks per week and how often a drinker exceeds daily limits.  Discussed cessation/primary prevention of drug use and availability of treatment for abuse.   Diet: Encouraged to adjust caloric intake to maintain  or achieve ideal body weight, to reduce intake of dietary saturated fat and total fat, to limit sodium intake by avoiding high sodium foods and not adding table salt, and to maintain adequate dietary potassium and calcium preferably from fresh fruits, vegetables, and low-fat dairy products.    stressed the  importance of regular exercise  Injury prevention: Discussed safety belts, safety helmets, smoke detector, smoking near bedding or upholstery.   Dental health: Discussed importance of regular tooth brushing, flossing, and dental visits.    NEXT PREVENTATIVE PHYSICAL DUE IN 1 YEAR. No follow-ups on file.

## 2023-01-31 ENCOUNTER — Telehealth: Payer: Self-pay

## 2023-01-31 ENCOUNTER — Encounter: Payer: Self-pay | Admitting: Nurse Practitioner

## 2023-01-31 ENCOUNTER — Ambulatory Visit: Payer: BC Managed Care – PPO | Admitting: Nurse Practitioner

## 2023-01-31 VITALS — BP 136/73 | HR 62 | Temp 98.3°F | Ht 67.0 in | Wt 124.4 lb

## 2023-01-31 DIAGNOSIS — Z0000 Encounter for general adult medical examination without abnormal findings: Secondary | ICD-10-CM | POA: Diagnosis not present

## 2023-01-31 DIAGNOSIS — Z136 Encounter for screening for cardiovascular disorders: Secondary | ICD-10-CM

## 2023-01-31 DIAGNOSIS — Z1231 Encounter for screening mammogram for malignant neoplasm of breast: Secondary | ICD-10-CM

## 2023-01-31 DIAGNOSIS — F900 Attention-deficit hyperactivity disorder, predominantly inattentive type: Secondary | ICD-10-CM

## 2023-01-31 DIAGNOSIS — Z114 Encounter for screening for human immunodeficiency virus [HIV]: Secondary | ICD-10-CM | POA: Diagnosis not present

## 2023-01-31 DIAGNOSIS — F339 Major depressive disorder, recurrent, unspecified: Secondary | ICD-10-CM | POA: Diagnosis not present

## 2023-01-31 LAB — URINALYSIS, ROUTINE W REFLEX MICROSCOPIC
Bilirubin, UA: NEGATIVE
Glucose, UA: NEGATIVE
Ketones, UA: NEGATIVE
Leukocytes,UA: NEGATIVE
Nitrite, UA: NEGATIVE
Protein,UA: NEGATIVE
RBC, UA: NEGATIVE
Specific Gravity, UA: 1.025 (ref 1.005–1.030)
Urobilinogen, Ur: 0.2 mg/dL (ref 0.2–1.0)
pH, UA: 6 (ref 5.0–7.5)

## 2023-01-31 MED ORDER — SERTRALINE HCL 100 MG PO TABS: 200.0000 mg | ORAL_TABLET | Freq: Every day | ORAL | 1 refills | Status: DC

## 2023-01-31 NOTE — Assessment & Plan Note (Signed)
Chronic. Not well controlled.  Was previously taking Adderall 20mg  BID which felt like too much medication.  Then switched to Adderall 10mg  BID which doesn't feel like enough.  She would like to increase to 15mg  BID.  Will send in medications for 2 months.  Follow up in 2 months. Call sooner if concerns arise.

## 2023-01-31 NOTE — Assessment & Plan Note (Signed)
Chronic.  Controlled.  Continue with current medication regimen of Zoloft '200mg'$  daily.  Refills sent today.  Labs ordered today.  Return to clinic in 6 months for reevaluation.  Call sooner if concerns arise.

## 2023-01-31 NOTE — Telephone Encounter (Signed)
Patient is scheduled for Screening Mammogram on November 1st at [facility] at [place] location.

## 2023-01-31 NOTE — Telephone Encounter (Signed)
-----   Message from Larae Grooms sent at 01/31/2023 10:35 AM EDT ----- Can we schedule her mammogram

## 2023-02-01 LAB — COMPREHENSIVE METABOLIC PANEL
ALT: 35 [IU]/L — ABNORMAL HIGH (ref 0–32)
AST: 32 [IU]/L (ref 0–40)
Albumin: 4.5 g/dL (ref 3.8–4.9)
Alkaline Phosphatase: 62 [IU]/L (ref 44–121)
BUN/Creatinine Ratio: 16 (ref 9–23)
BUN: 12 mg/dL (ref 6–24)
Bilirubin Total: 0.3 mg/dL (ref 0.0–1.2)
CO2: 26 mmol/L (ref 20–29)
Calcium: 9.5 mg/dL (ref 8.7–10.2)
Chloride: 100 mmol/L (ref 96–106)
Creatinine, Ser: 0.75 mg/dL (ref 0.57–1.00)
Globulin, Total: 2.2 g/dL (ref 1.5–4.5)
Glucose: 91 mg/dL (ref 70–99)
Potassium: 3.7 mmol/L (ref 3.5–5.2)
Sodium: 139 mmol/L (ref 134–144)
Total Protein: 6.7 g/dL (ref 6.0–8.5)
eGFR: 93 mL/min/{1.73_m2} (ref >59)

## 2023-02-01 LAB — LIPID PANEL
Chol/HDL Ratio: 3 ratio (ref 0.0–4.4)
Cholesterol, Total: 182 mg/dL (ref 100–199)
HDL: 61 mg/dL (ref >39)
LDL Chol Calc (NIH): 107 mg/dL — ABNORMAL HIGH (ref 0–99)
Triglycerides: 76 mg/dL (ref 0–149)
VLDL Cholesterol Cal: 14 mg/dL (ref 5–40)

## 2023-02-01 LAB — CBC WITH DIFFERENTIAL/PLATELET
Basophils Absolute: 0 10*3/uL (ref 0.0–0.2)
Basos: 1 %
EOS (ABSOLUTE): 0.1 10*3/uL (ref 0.0–0.4)
Eos: 2 %
Hematocrit: 42.2 % (ref 34.0–46.6)
Hemoglobin: 13.8 g/dL (ref 11.1–15.9)
Immature Grans (Abs): 0 10*3/uL (ref 0.0–0.1)
Immature Granulocytes: 0 %
Lymphocytes Absolute: 1.4 10*3/uL (ref 0.7–3.1)
Lymphs: 28 %
MCH: 33.8 pg — ABNORMAL HIGH (ref 26.6–33.0)
MCHC: 32.7 g/dL (ref 31.5–35.7)
MCV: 103 fL — ABNORMAL HIGH (ref 79–97)
Monocytes Absolute: 0.6 10*3/uL (ref 0.1–0.9)
Monocytes: 11 %
Neutrophils Absolute: 3 10*3/uL (ref 1.4–7.0)
Neutrophils: 58 %
Platelets: 231 10*3/uL (ref 150–450)
RBC: 4.08 x10E6/uL (ref 3.77–5.28)
RDW: 11.8 % (ref 11.7–15.4)
WBC: 5.2 10*3/uL (ref 3.4–10.8)

## 2023-02-01 LAB — TSH: TSH: 0.965 u[IU]/mL (ref 0.450–4.500)

## 2023-02-01 LAB — HIV ANTIBODY (ROUTINE TESTING W REFLEX): HIV Screen 4th Generation wRfx: NONREACTIVE

## 2023-02-07 MED ORDER — AMPHETAMINE-DEXTROAMPHET ER 15 MG PO CP24: 15.0000 mg | ORAL_CAPSULE | ORAL | 0 refills | Status: DC

## 2023-02-11 ENCOUNTER — Ambulatory Visit
Admission: RE | Admit: 2023-02-11 | Discharge: 2023-02-11 | Disposition: A | Discharge status: Home / Self Care | Payer: BC Managed Care – PPO | Source: Ambulatory Visit | Attending: Nurse Practitioner

## 2023-02-11 ENCOUNTER — Other Ambulatory Visit: Payer: Self-pay | Admitting: Nurse Practitioner

## 2023-02-11 DIAGNOSIS — F900 Attention-deficit hyperactivity disorder, predominantly inattentive type: Secondary | ICD-10-CM

## 2023-02-11 DIAGNOSIS — F339 Major depressive disorder, recurrent, unspecified: Secondary | ICD-10-CM

## 2023-02-11 DIAGNOSIS — Z1231 Encounter for screening mammogram for malignant neoplasm of breast: Secondary | ICD-10-CM | POA: Diagnosis not present

## 2023-02-11 DIAGNOSIS — Z114 Encounter for screening for human immunodeficiency virus [HIV]: Secondary | ICD-10-CM

## 2023-02-11 DIAGNOSIS — Z136 Encounter for screening for cardiovascular disorders: Secondary | ICD-10-CM

## 2023-02-11 DIAGNOSIS — Z0000 Encounter for general adult medical examination without abnormal findings: Secondary | ICD-10-CM

## 2023-03-30 ENCOUNTER — Encounter: Payer: Self-pay | Admitting: Nurse Practitioner

## 2023-03-30 ENCOUNTER — Ambulatory Visit: Payer: Self-pay | Admitting: *Deleted

## 2023-03-30 ENCOUNTER — Ambulatory Visit: Payer: BC Managed Care – PPO | Admitting: Nurse Practitioner

## 2023-03-30 VITALS — BP 159/77 | HR 81 | Temp 97.9°F | Ht 67.0 in | Wt 119.8 lb

## 2023-03-30 DIAGNOSIS — R051 Acute cough: Secondary | ICD-10-CM

## 2023-03-30 MED ORDER — AMOXICILLIN 500 MG PO CAPS: 500.0000 mg | ORAL_CAPSULE | Freq: Two times a day (BID) | ORAL | 0 refills | Status: AC

## 2023-03-30 NOTE — Progress Notes (Signed)
BP (!) 159/77 (BP Location: Left Arm, Patient Position: Sitting, Cuff Size: Normal)   Pulse 81   Temp 97.9 F (36.6 C) (Oral)   Ht 5\' 7"  (1.702 m)   Wt 119 lb 12.8 oz (54.3 kg)   LMP 10/25/2011   SpO2 97%   BMI 18.76 kg/m    Subjective:    Patient ID: Chelsea Velez, female    DOB: 25-Jan-1966, 57 y.o.   MRN: [id number]  HPI: Chelsea Velez is a 57 y.o. female  Chief Complaint  Patient presents with   Shortness of Breath    Started on dec 8th but noticed on dec 6th, started with a sore throat, no fever, feels like theres something stuck in throat, no inhaler   UPPER RESPIRATORY TRACT INFECTION Worst symptom: symptoms started on December 6 Fever: no Cough: yes Shortness of breath: yes Wheezing: yes Chest pain: no Chest tightness: yes Chest congestion: yes Nasal congestion: yes Runny nose: yes Post nasal drip: yes Sneezing: no Sore throat: no Swollen glands: no Sinus pressure: yes Headache: yes Face pain: no Toothache: no Ear pain: no bilateral Ear pressure: no bilateral Eyes red/itching:no Eye drainage/crusting: no  Vomiting: no Rash: no Fatigue: yes Sick contacts: no Strep contacts: no  Context: worse Recurrent sinusitis: no Relief with OTC cold/cough medications: yes  Treatments attempted: cold/sinus   Relevant past medical, surgical, family and social history reviewed and updated as indicated. Interim medical history since our last visit reviewed. Allergies and medications reviewed and updated.  Review of Systems  Constitutional:  Positive for fatigue. Negative for fever.  HENT:  Positive for congestion, postnasal drip, rhinorrhea, sinus pressure and sinus pain. Negative for dental problem, ear pain, sneezing and sore throat.   Respiratory:  Positive for cough, shortness of breath and wheezing.   Cardiovascular:  Negative for chest pain.  Gastrointestinal:  Negative for vomiting.  Skin:  Negative for rash.  Neurological:  Positive for headaches.     Per HPI unless specifically indicated above     Objective:    BP (!) 159/77 (BP Location: Left Arm, Patient Position: Sitting, Cuff Size: Normal)   Pulse 81   Temp 97.9 F (36.6 C) (Oral)   Ht 5\' 7"  (1.702 m)   Wt 119 lb 12.8 oz (54.3 kg)   LMP 10/25/2011   SpO2 97%   BMI 18.76 kg/m   Wt Readings from Last 3 Encounters:  03/30/23 119 lb 12.8 oz (54.3 kg)  01/31/23 124 lb 6.4 oz (56.4 kg)  09/27/22 119 lb 6.4 oz (54.2 kg)    Physical Exam Vitals and nursing note reviewed.  Constitutional:      General: She is not in acute distress.    Appearance: Normal appearance. She is normal weight. She is not ill-appearing, toxic-appearing or diaphoretic.  HENT:     Head: Normocephalic.     Right Ear: External ear normal.     Left Ear: External ear normal.     Nose: Congestion and rhinorrhea present.     Right Sinus: No maxillary sinus tenderness or frontal sinus tenderness.     Left Sinus: No maxillary sinus tenderness or frontal sinus tenderness.     Mouth/Throat:     Mouth: Mucous membranes are moist.     Pharynx: Oropharynx is clear. Posterior oropharyngeal erythema present. No oropharyngeal exudate.  Eyes:     General:        Right eye: No discharge.        Left eye: No  discharge.     Extraocular Movements: Extraocular movements intact.     Conjunctiva/sclera: Conjunctivae normal.     Pupils: Pupils are equal, round, and reactive to light.  Cardiovascular:     Rate and Rhythm: Normal rate and regular rhythm.     Heart sounds: No murmur heard. Pulmonary:     Effort: Pulmonary effort is normal. No respiratory distress.     Breath sounds: Normal breath sounds. No wheezing or rales.  Musculoskeletal:     Cervical back: Normal range of motion and neck supple.  Skin:    General: Skin is warm and dry.     Capillary Refill: Capillary refill takes less than 2 seconds.  Neurological:     General: No focal deficit present.     Mental Status: She is alert and oriented to  person, place, and time. Mental status is at baseline.  Psychiatric:        Mood and Affect: Mood normal.        Behavior: Behavior normal.        Thought Content: Thought content normal.        Judgment: Judgment normal.     Results for orders placed or performed in visit on 01/31/23  Urinalysis, Routine w reflex microscopic   Collection Time: 01/31/23 10:37 AM  Result Value Ref Range   Specific Gravity, UA 1.025 1.005 - 1.030   pH, UA 6.0 5.0 - 7.5   Color, UA Yellow Yellow   Appearance Ur Clear Clear   Leukocytes,UA Negative Negative   Protein,UA Negative Negative/Trace   Glucose, UA Negative Negative   Ketones, UA Negative Negative   RBC, UA Negative Negative   Bilirubin, UA Negative Negative   Urobilinogen, Ur 0.2 0.2 - 1.0 mg/dL   Nitrite, UA Negative Negative   Microscopic Examination Comment   CBC with Differential/Platelet   Collection Time: 01/31/23 10:44 AM  Result Value Ref Range   WBC 5.2 3.4 - 10.8 x10E3/uL   RBC 4.08 3.77 - 5.28 x10E6/uL   Hemoglobin 13.8 11.1 - 15.9 g/dL   Hematocrit 16.1 09.6 - 46.6 %   MCV 103 (H) 79 - 97 fL   MCH 33.8 (H) 26.6 - 33.0 pg   MCHC 32.7 31.5 - 35.7 g/dL   RDW 04.5 40.9 - 81.1 %   Platelets 231 150 - 450 x10E3/uL   Neutrophils 58 Not Estab. %   Lymphs 28 Not Estab. %   Monocytes 11 Not Estab. %   Eos 2 Not Estab. %   Basos 1 Not Estab. %   Neutrophils Absolute 3.0 1.4 - 7.0 x10E3/uL   Lymphocytes Absolute 1.4 0.7 - 3.1 x10E3/uL   Monocytes Absolute 0.6 0.1 - 0.9 x10E3/uL   EOS (ABSOLUTE) 0.1 0.0 - 0.4 x10E3/uL   Basophils Absolute 0.0 0.0 - 0.2 x10E3/uL   Immature Granulocytes 0 Not Estab. %   Immature Grans (Abs) 0.0 0.0 - 0.1 x10E3/uL  Comprehensive metabolic panel   Collection Time: 01/31/23 10:44 AM  Result Value Ref Range   Glucose 91 70 - 99 mg/dL   BUN 12 6 - 24 mg/dL   Creatinine, Ser 9.14 0.57 - 1.00 mg/dL   eGFR 93 >78 GN/FAO/1.30   BUN/Creatinine Ratio 16 9 - 23   Sodium 139 134 - 144 mmol/L    Potassium 3.7 3.5 - 5.2 mmol/L   Chloride 100 96 - 106 mmol/L   CO2 26 20 - 29 mmol/L   Calcium 9.5 8.7 - 10.2 mg/dL   Total  Protein 6.7 6.0 - 8.5 g/dL   Albumin 4.5 3.8 - 4.9 g/dL   Globulin, Total 2.2 1.5 - 4.5 g/dL   Bilirubin Total 0.3 0.0 - 1.2 mg/dL   Alkaline Phosphatase 62 44 - 121 IU/L   AST 32 0 - 40 IU/L   ALT 35 (H) 0 - 32 IU/L  Lipid panel   Collection Time: 01/31/23 10:44 AM  Result Value Ref Range   Cholesterol, Total 182 100 - 199 mg/dL   Triglycerides 76 0 - 149 mg/dL   HDL 61 >09 mg/dL   VLDL Cholesterol Cal 14 5 - 40 mg/dL   LDL Chol Calc (NIH) 811 (H) 0 - 99 mg/dL   Chol/HDL Ratio 3.0 0.0 - 4.4 ratio  TSH   Collection Time: 01/31/23 10:44 AM  Result Value Ref Range   TSH 0.965 0.450 - 4.500 uIU/mL  HIV Antibody (routine testing w rflx)   Collection Time: 01/31/23 10:44 AM  Result Value Ref Range   HIV Screen 4th Generation wRfx Non Reactive Non Reactive      Assessment & Plan:   Problem List Items Addressed This Visit   None Visit Diagnoses       Acute cough    -  Primary   Symptoms have been on going x 2 weeks. Will treat with amoxicillin. Complete course of antibiotics.  Rest and stay well hydrated.        Follow up plan: Return if symptoms worsen or fail to improve.

## 2023-03-30 NOTE — Telephone Encounter (Signed)
  Chief Complaint: SOB Symptoms:  Frequency: 2 weeks Pertinent Negatives: Patient denies  Disposition: [] ED /[] Urgent Care (no appt availability in office) / [x] Appointment(In office/virtual)/ []  [facility]/ [] Home Care/ [] Refused Recommended Disposition /[] [facility]/ []  Follow-up with PCP Additional Notes:   Triage incomplete. Pt crying, audible wheeze, SOB, speech faltering. Advised ED. Pt states she is afraid of germs and will not go. Reiterated need for ED eval, pt sobbing, declines. Called practice for consult, [facility]. Appt secured for 3:20 today. Advised ED for worsening symptoms.  Reason for Disposition  [1] MODERATE difficulty breathing (e.g., speaks in phrases, SOB even at rest, pulse 100-120) AND [2] NEW-onset or WORSE than normal  Answer Assessment - Initial Assessment Questions 1. RESPIRATORY STATUS: "Describe your breathing?" (e.g., wheezing, shortness of breath, unable to speak, severe coughing)      *No Answer* 2. ONSET: "When did this breathing problem begin?"      2 weeks ago 3. PATTERN "Does the difficult breathing come and go, or has it been constant since it started?"      *No Answer* 4. SEVERITY: "How bad is your breathing?" (e.g., mild, moderate, severe)    - MILD: No SOB at rest, mild SOB with walking, speaks normally in sentences, can lie down, no retractions, pulse < 100.    - MODERATE: SOB at rest, SOB with minimal exertion and prefers to sit, cannot lie down flat, speaks in phrases, mild retractions, audible wheezing, pulse 100-120.    - SEVERE: Very SOB at rest, speaks in single words, struggling to breathe, sitting hunched forward, retractions, pulse > 120      *No Answer* 5. RECURRENT SYMPTOM: "Have you had difficulty breathing before?" If Yes, ask: "When was the last time?" and "What happened that time?"      *No Answer* 6. CARDIAC HISTORY: "Do you have any history of heart disease?" (e.g., heart attack, angina, bypass surgery,  angioplasty)      *No Answer* 7. LUNG HISTORY: "Do you have any history of lung disease?"  (e.g., pulmonary embolus, asthma, emphysema)     *No Answer* 8. CAUSE: "What do you think is causing the breathing problem?"      *No Answer* 9. OTHER SYMPTOMS: "Do you have any other symptoms? (e.g., dizziness, runny nose, cough, chest pain, fever)     *No Answer* 10. O2 SATURATION MONITOR:  "Do you use an oxygen saturation monitor (pulse oximeter) at home?" If Yes, ask: "What is your reading (oxygen level) today?" "What is your usual oxygen saturation reading?" (e.g., 95%)       *No Answer* 11. PREGNANCY: "Is there any chance you are pregnant?" "When was your last menstrual period?"       *No Answer* 12. TRAVEL: "Have you traveled out of the country in the last month?" (e.g., travel history, exposures)       *No Answer*  Protocols used: Breathing Difficulty-A-AH

## 2023-04-14 ENCOUNTER — Ambulatory Visit: Payer: BC Managed Care – PPO | Admitting: Nurse Practitioner

## 2023-04-21 ENCOUNTER — Other Ambulatory Visit: Payer: Self-pay | Admitting: Nurse Practitioner

## 2023-04-25 NOTE — Telephone Encounter (Signed)
 Requested Prescriptions  Pending Prescriptions Disp Refills   gabapentin  (NEURONTIN ) 600 MG tablet [Pharmacy Med Name: GABAPENTIN  600 MG TABLET] 180 tablet 0    Sig: Take 1 tablet (600 mg total) by mouth 2 (two) times daily.     Neurology: Anticonvulsants - gabapentin  Passed - 04/25/2023 12:14 PM      Passed - Cr in normal range and within 360 days    Creatinine  Date Value Ref Range Status  12/01/2020 204.7 20.0 - 300.0 mg/dL Final   Creatinine, Ser  Date Value Ref Range Status  01/31/2023 0.75 0.57 - 1.00 mg/dL Final         Passed - Completed PHQ-2 or PHQ-9 in the last 360 days      Passed - Valid encounter within last 12 months    Recent Outpatient Visits           3 weeks ago Acute cough   [place] [facility] Melvin Pao, NP   2 months ago Annual physical exam   [place] [facility] Melvin Pao, NP   7 months ago Attention deficit hyperactivity disorder (ADHD), predominantly inattentive type   [place] [facility] Melvin Pao, NP   7 months ago Depression, recurrent [facility])   [place] [facility] Melvin Pao, NP   8 months ago Attention deficit hyperactivity disorder (ADHD), predominantly inattentive type   [place] [facility] Melvin Pao, NP

## 2023-07-04 ENCOUNTER — Ambulatory Visit: Admitting: Nurse Practitioner

## 2023-07-04 ENCOUNTER — Encounter: Payer: Self-pay | Admitting: Nurse Practitioner

## 2023-07-04 VITALS — BP 99/64 | HR 65 | Ht 67.0 in | Wt 124.2 lb

## 2023-07-04 DIAGNOSIS — F900 Attention-deficit hyperactivity disorder, predominantly inattentive type: Secondary | ICD-10-CM | POA: Diagnosis not present

## 2023-07-04 DIAGNOSIS — K2101 Gastro-esophageal reflux disease with esophagitis, with bleeding: Secondary | ICD-10-CM | POA: Diagnosis not present

## 2023-07-04 DIAGNOSIS — F339 Major depressive disorder, recurrent, unspecified: Secondary | ICD-10-CM | POA: Diagnosis not present

## 2023-07-04 MED ORDER — OMEPRAZOLE 20 MG PO CPDR: 20.0000 mg | DELAYED_RELEASE_CAPSULE | Freq: Every day | ORAL | 1 refills | Status: DC

## 2023-07-04 MED ORDER — AMPHETAMINE-DEXTROAMPHET ER 10 MG PO CP24: 10.0000 mg | ORAL_CAPSULE | Freq: Two times a day (BID) | ORAL | 0 refills | Status: DC

## 2023-07-04 NOTE — Assessment & Plan Note (Signed)
 Chronic. Not well controlled.  Will increase Adderall to 10mg  BID.  Follow up in 1 month.  Call sooner if concerns arise.

## 2023-07-04 NOTE — Assessment & Plan Note (Signed)
Chronic.  Controlled.  Continue with current medication regimen.  Return to clinic in 6 months for reevaluation.  Call sooner if concerns arise.  ? ?

## 2023-07-04 NOTE — Progress Notes (Signed)
 BP 99/64 (BP Location: Left Arm, Patient Position: Sitting, Cuff Size: Large)   Pulse 65   Ht 5\' 7"  (1.702 m)   Wt 124 lb 3.2 oz (56.3 kg)   LMP 10/25/2011   SpO2 96%   BMI 19.45 kg/m    Subjective:    Patient ID: Chelsea Velez, female    DOB: 1965-09-16, 58 y.o.   MRN: [id number]  HPI: Chelsea Velez is a 58 y.o. female  Chief Complaint  Patient presents with   Cough    Feels as the lining of the lungs are inflamed.    Shortness of Breath   Medication Refill    All medications   DEPRESSION Patient states she has been taking Zoloft 200mg  daily.  She feels like this is a great dose for her and she understands she needs to continue with the medication.  Denies SI. No longer crying.     Flowsheet Row Office Visit from 07/04/2023 in [facility]  PHQ-9 Total Score 15         07/04/2023    2:19 PM 01/31/2023   10:28 AM 09/27/2022   11:20 AM 08/30/2022    2:14 PM  GAD 7 : Generalized Anxiety Score  Nervous, Anxious, on Edge 0 0 0 0  Control/stop worrying 0 0 0 0  Worry too much - different things 0 0 0 0  Trouble relaxing 0 0 1 1  Restless 0 0 1 1  Easily annoyed or irritable 0 2 1 1   Afraid - awful might happen 0 0  0  Total GAD 7 Score 0 2  3       ADHD FOLLOW UP Pateint states she has been taking 1 15mg  dose of medication.  Her son and people at work have noticed that she is more forgetful.  She feels like being on 2 10mg  doses per day.   ADHD status: controlled Satisfied with current therapy: yes Medication compliance:  excellent compliance Controlled substance contract: yes Previous psychiatry evaluation: yes Previous medications: yes adderall XR   Taking meds on weekends/vacations: yes Work/school performance:  excellent Difficulty sustaining attention/completing tasks: no Distracted by extraneous stimuli: no Does not listen when spoken to: no  Fidgets with hands or feet: no Unable to stay in seat: no Blurts out/interrupts  others: no ADHD Medication Side Effects: no    Decreased appetite: no    Headache: no    Sleeping disturbance pattern: no    Irritability: no    Rebound effects (worse than baseline) off medication: no    Anxiousness: no    Dizziness: no    Tics: no  Patient states she has been having a cough and some chest pain.  States the pain makes it feel like she can't move.      Relevant past medical, surgical, family and social history reviewed and updated as indicated. Interim medical history since our last visit reviewed. Allergies and medications reviewed and updated.  Review of Systems  Constitutional:  Negative for appetite change.  Neurological:  Negative for dizziness.  Psychiatric/Behavioral:  Positive for decreased concentration. Negative for agitation and sleep disturbance. The patient is not nervous/anxious.     Per HPI unless specifically indicated above     Objective:    BP 99/64 (BP Location: Left Arm, Patient Position: Sitting, Cuff Size: Large)   Pulse 65   Ht 5\' 7"  (1.702 m)   Wt 124 lb 3.2 oz (56.3 kg)   LMP 10/25/2011  SpO2 96%   BMI 19.45 kg/m   Wt Readings from Last 3 Encounters:  07/04/23 124 lb 3.2 oz (56.3 kg)  03/30/23 119 lb 12.8 oz (54.3 kg)  01/31/23 124 lb 6.4 oz (56.4 kg)    Physical Exam Vitals and nursing note reviewed.  Constitutional:      General: She is not in acute distress.    Appearance: Normal appearance. She is normal weight. She is not ill-appearing, toxic-appearing or diaphoretic.  HENT:     Head: Normocephalic.     Right Ear: External ear normal.     Left Ear: External ear normal.     Nose: Nose normal.     Mouth/Throat:     Mouth: Mucous membranes are moist.     Pharynx: Oropharynx is clear.  Eyes:     General:        Right eye: No discharge.        Left eye: No discharge.     Extraocular Movements: Extraocular movements intact.     Conjunctiva/sclera: Conjunctivae normal.     Pupils: Pupils are equal, round, and  reactive to light.  Cardiovascular:     Rate and Rhythm: Normal rate and regular rhythm.     Heart sounds: No murmur heard. Pulmonary:     Effort: Pulmonary effort is normal. No respiratory distress.     Breath sounds: Normal breath sounds. No wheezing or rales.  Musculoskeletal:     Cervical back: Normal range of motion and neck supple.  Skin:    General: Skin is warm and dry.     Capillary Refill: Capillary refill takes less than 2 seconds.  Neurological:     General: No focal deficit present.     Mental Status: She is alert and oriented to person, place, and time. Mental status is at baseline.  Psychiatric:        Mood and Affect: Mood normal.        Behavior: Behavior normal.        Thought Content: Thought content normal.        Judgment: Judgment normal.     Results for orders placed or performed in visit on 01/31/23  Urinalysis, Routine w reflex microscopic   Collection Time: 01/31/23 10:37 AM  Result Value Ref Range   Specific Gravity, UA 1.025 1.005 - 1.030   pH, UA 6.0 5.0 - 7.5   Color, UA Yellow Yellow   Appearance Ur Clear Clear   Leukocytes,UA Negative Negative   Protein,UA Negative Negative/Trace   Glucose, UA Negative Negative   Ketones, UA Negative Negative   RBC, UA Negative Negative   Bilirubin, UA Negative Negative   Urobilinogen, Ur 0.2 0.2 - 1.0 mg/dL   Nitrite, UA Negative Negative   Microscopic Examination Comment   CBC with Differential/Platelet   Collection Time: 01/31/23 10:44 AM  Result Value Ref Range   WBC 5.2 3.4 - 10.8 x10E3/uL   RBC 4.08 3.77 - 5.28 x10E6/uL   Hemoglobin 13.8 11.1 - 15.9 g/dL   Hematocrit 11.9 14.7 - 46.6 %   MCV 103 (H) 79 - 97 fL   MCH 33.8 (H) 26.6 - 33.0 pg   MCHC 32.7 31.5 - 35.7 g/dL   RDW 82.9 56.2 - 13.0 %   Platelets 231 150 - 450 x10E3/uL   Neutrophils 58 Not Estab. %   Lymphs 28 Not Estab. %   Monocytes 11 Not Estab. %   Eos 2 Not Estab. %   Basos 1 Not Estab. %  Neutrophils Absolute 3.0 1.4 - 7.0  x10E3/uL   Lymphocytes Absolute 1.4 0.7 - 3.1 x10E3/uL   Monocytes Absolute 0.6 0.1 - 0.9 x10E3/uL   EOS (ABSOLUTE) 0.1 0.0 - 0.4 x10E3/uL   Basophils Absolute 0.0 0.0 - 0.2 x10E3/uL   Immature Granulocytes 0 Not Estab. %   Immature Grans (Abs) 0.0 0.0 - 0.1 x10E3/uL  Comprehensive metabolic panel   Collection Time: 01/31/23 10:44 AM  Result Value Ref Range   Glucose 91 70 - 99 mg/dL   BUN 12 6 - 24 mg/dL   Creatinine, Ser 1.61 0.57 - 1.00 mg/dL   eGFR 93 >09 UE/AVW/0.98   BUN/Creatinine Ratio 16 9 - 23   Sodium 139 134 - 144 mmol/L   Potassium 3.7 3.5 - 5.2 mmol/L   Chloride 100 96 - 106 mmol/L   CO2 26 20 - 29 mmol/L   Calcium 9.5 8.7 - 10.2 mg/dL   Total Protein 6.7 6.0 - 8.5 g/dL   Albumin 4.5 3.8 - 4.9 g/dL   Globulin, Total 2.2 1.5 - 4.5 g/dL   Bilirubin Total 0.3 0.0 - 1.2 mg/dL   Alkaline Phosphatase 62 44 - 121 IU/L   AST 32 0 - 40 IU/L   ALT 35 (H) 0 - 32 IU/L  Lipid panel   Collection Time: 01/31/23 10:44 AM  Result Value Ref Range   Cholesterol, Total 182 100 - 199 mg/dL   Triglycerides 76 0 - 149 mg/dL   HDL 61 >11 mg/dL   VLDL Cholesterol Cal 14 5 - 40 mg/dL   LDL Chol Calc (NIH) 914 (H) 0 - 99 mg/dL   Chol/HDL Ratio 3.0 0.0 - 4.4 ratio  TSH   Collection Time: 01/31/23 10:44 AM  Result Value Ref Range   TSH 0.965 0.450 - 4.500 uIU/mL  HIV Antibody (routine testing w rflx)   Collection Time: 01/31/23 10:44 AM  Result Value Ref Range   HIV Screen 4th Generation wRfx Non Reactive Non Reactive      Assessment & Plan:   Problem List Items Addressed This Visit       Other   Attention deficit hyperactivity disorder (ADHD), predominantly inattentive type   Chronic. Not well controlled.  Will increase Adderall to 10mg  BID.  Follow up in 1 month.  Call sooner if concerns arise.       Depression, recurrent (HCC)   Chronic.  Controlled.  Continue with current medication regimen.  Return to clinic in 6 months for reevaluation.  Call sooner if concerns arise.         Other Visit Diagnoses       Gastroesophageal reflux disease with esophagitis and hemorrhage    -  Primary   Will start Omeprazole daily.  Follow up in 1 month.  Call sooner if concerns arise.  Side effects and benefits of medication during visit.        Follow up plan: Return in about 1 month (around 08/04/2023) for ADHD FU.

## 2023-08-08 ENCOUNTER — Encounter: Payer: Self-pay | Admitting: Nurse Practitioner

## 2023-08-08 ENCOUNTER — Ambulatory Visit: Admitting: Nurse Practitioner

## 2023-08-08 VITALS — BP 106/58 | HR 73 | Temp 98.2°F | Resp 18 | Ht 67.01 in | Wt 121.6 lb

## 2023-08-08 DIAGNOSIS — Z1211 Encounter for screening for malignant neoplasm of colon: Secondary | ICD-10-CM

## 2023-08-08 DIAGNOSIS — R058 Other specified cough: Secondary | ICD-10-CM | POA: Diagnosis not present

## 2023-08-08 DIAGNOSIS — F900 Attention-deficit hyperactivity disorder, predominantly inattentive type: Secondary | ICD-10-CM

## 2023-08-08 MED ORDER — AMPHETAMINE-DEXTROAMPHET ER 15 MG PO CP24: 15.0000 mg | ORAL_CAPSULE | Freq: Two times a day (BID) | ORAL | 0 refills | Status: DC

## 2023-08-08 NOTE — Assessment & Plan Note (Signed)
 Chronic. Controlled.  Continue with Adderall to 15mg  BID.  Follow up in 3 month.  PDMP ehcked during visit.  Call sooner if concerns arise.

## 2023-08-08 NOTE — Progress Notes (Signed)
 BP (!) 106/58 (BP Location: Right Arm, Patient Position: Sitting, Cuff Size: Normal)   Pulse 73   Temp 98.2 F (36.8 C) (Oral)   Resp 18   Ht 5' 7.01" (1.702 m)   Wt 121 lb 9.6 oz (55.2 kg)   LMP 10/25/2011   SpO2 98%   BMI 19.04 kg/m    Subjective:    Patient ID: Chelsea Velez, female    DOB: 17-Oct-1965, 58 y.o.   MRN: [id number]  HPI: Chelsea Velez is a 58 y.o. female  Chief Complaint  Patient presents with   ADHD    Would like to consider dose increase partly due to constraints with insurance. Feels she needs two 15's versus 10's.    Gastroesophageal Reflux    Is not as consistent. Wonders if something related to pollen as she has been able to control pain by staying indoors. Pain is only about once a week now.    ADHD FOLLOW UP Pateint states she is having difficulty getting both doses of the medication.  She would like to get 2 15mg  doses to make it easier.   ADHD status: controlled Satisfied with current therapy: yes Medication compliance:  excellent compliance Controlled substance contract: yes Previous psychiatry evaluation: yes Previous medications: yes adderall XR   Taking meds on weekends/vacations: yes Work/school performance:  excellent Difficulty sustaining attention/completing tasks: no Distracted by extraneous stimuli: no Does not listen when spoken to: no  Fidgets with hands or feet: no Unable to stay in seat: no Blurts out/interrupts others: no ADHD Medication Side Effects: no    Decreased appetite: no    Headache: no    Sleeping disturbance pattern: no    Irritability: no    Rebound effects (worse than baseline) off medication: no    Anxiousness: no    Dizziness: no    Tics: no  Patient states she took the omeprazole  which did not help her symptoms.  She feels like it is more related to allergies.  She is not currently taking an antihistamine.    Relevant past medical, surgical, family and social history reviewed and updated as  indicated. Interim medical history since our last visit reviewed. Allergies and medications reviewed and updated.  Review of Systems  Constitutional:  Negative for appetite change.  Neurological:  Negative for dizziness.  Psychiatric/Behavioral:  Positive for decreased concentration. Negative for agitation and sleep disturbance. The patient is not nervous/anxious.     Per HPI unless specifically indicated above     Objective:    BP (!) 106/58 (BP Location: Right Arm, Patient Position: Sitting, Cuff Size: Normal)   Pulse 73   Temp 98.2 F (36.8 C) (Oral)   Resp 18   Ht 5' 7.01" (1.702 m)   Wt 121 lb 9.6 oz (55.2 kg)   LMP 10/25/2011   SpO2 98%   BMI 19.04 kg/m   Wt Readings from Last 3 Encounters:  08/08/23 121 lb 9.6 oz (55.2 kg)  07/04/23 124 lb 3.2 oz (56.3 kg)  03/30/23 119 lb 12.8 oz (54.3 kg)    Physical Exam Vitals and nursing note reviewed.  Constitutional:      General: She is not in acute distress.    Appearance: Normal appearance. She is normal weight. She is not ill-appearing, toxic-appearing or diaphoretic.  HENT:     Head: Normocephalic.     Right Ear: External ear normal.     Left Ear: External ear normal.     Nose: Nose normal.  Mouth/Throat:     Mouth: Mucous membranes are moist.     Pharynx: Oropharynx is clear.  Eyes:     General:        Right eye: No discharge.        Left eye: No discharge.     Extraocular Movements: Extraocular movements intact.     Conjunctiva/sclera: Conjunctivae normal.     Pupils: Pupils are equal, round, and reactive to light.  Cardiovascular:     Rate and Rhythm: Normal rate and regular rhythm.     Heart sounds: No murmur heard. Pulmonary:     Effort: Pulmonary effort is normal. No respiratory distress.     Breath sounds: Normal breath sounds. No wheezing or rales.  Musculoskeletal:     Cervical back: Normal range of motion and neck supple.  Skin:    General: Skin is warm and dry.     Capillary Refill:  Capillary refill takes less than 2 seconds.  Neurological:     General: No focal deficit present.     Mental Status: She is alert and oriented to person, place, and time. Mental status is at baseline.  Psychiatric:        Mood and Affect: Mood normal.        Behavior: Behavior normal.        Thought Content: Thought content normal.        Judgment: Judgment normal.     Results for orders placed or performed in visit on 01/31/23  Urinalysis, Routine w reflex microscopic   Collection Time: 01/31/23 10:37 AM  Result Value Ref Range   Specific Gravity, UA 1.025 1.005 - 1.030   pH, UA 6.0 5.0 - 7.5   Color, UA Yellow Yellow   Appearance Ur Clear Clear   Leukocytes,UA Negative Negative   Protein,UA Negative Negative/Trace   Glucose, UA Negative Negative   Ketones, UA Negative Negative   RBC, UA Negative Negative   Bilirubin, UA Negative Negative   Urobilinogen, Ur 0.2 0.2 - 1.0 mg/dL   Nitrite, UA Negative Negative   Microscopic Examination Comment   CBC with Differential/Platelet   Collection Time: 01/31/23 10:44 AM  Result Value Ref Range   WBC 5.2 3.4 - 10.8 x10E3/uL   RBC 4.08 3.77 - 5.28 x10E6/uL   Hemoglobin 13.8 11.1 - 15.9 g/dL   Hematocrit 84.6 96.2 - 46.6 %   MCV 103 (H) 79 - 97 fL   MCH 33.8 (H) 26.6 - 33.0 pg   MCHC 32.7 31.5 - 35.7 g/dL   RDW 95.2 84.1 - 32.4 %   Platelets 231 150 - 450 x10E3/uL   Neutrophils 58 Not Estab. %   Lymphs 28 Not Estab. %   Monocytes 11 Not Estab. %   Eos 2 Not Estab. %   Basos 1 Not Estab. %   Neutrophils Absolute 3.0 1.4 - 7.0 x10E3/uL   Lymphocytes Absolute 1.4 0.7 - 3.1 x10E3/uL   Monocytes Absolute 0.6 0.1 - 0.9 x10E3/uL   EOS (ABSOLUTE) 0.1 0.0 - 0.4 x10E3/uL   Basophils Absolute 0.0 0.0 - 0.2 x10E3/uL   Immature Granulocytes 0 Not Estab. %   Immature Grans (Abs) 0.0 0.0 - 0.1 x10E3/uL  Comprehensive metabolic panel   Collection Time: 01/31/23 10:44 AM  Result Value Ref Range   Glucose 91 70 - 99 mg/dL   BUN 12 6 - 24  mg/dL   Creatinine, Ser 4.01 0.57 - 1.00 mg/dL   eGFR 93 >02 VO/ZDG/6.44   BUN/Creatinine Ratio 16  9 - 23   Sodium 139 134 - 144 mmol/L   Potassium 3.7 3.5 - 5.2 mmol/L   Chloride 100 96 - 106 mmol/L   CO2 26 20 - 29 mmol/L   Calcium 9.5 8.7 - 10.2 mg/dL   Total Protein 6.7 6.0 - 8.5 g/dL   Albumin 4.5 3.8 - 4.9 g/dL   Globulin, Total 2.2 1.5 - 4.5 g/dL   Bilirubin Total 0.3 0.0 - 1.2 mg/dL   Alkaline Phosphatase 62 44 - 121 IU/L   AST 32 0 - 40 IU/L   ALT 35 (H) 0 - 32 IU/L  Lipid panel   Collection Time: 01/31/23 10:44 AM  Result Value Ref Range   Cholesterol, Total 182 100 - 199 mg/dL   Triglycerides 76 0 - 149 mg/dL   HDL 61 >16 mg/dL   VLDL Cholesterol Cal 14 5 - 40 mg/dL   LDL Chol Calc (NIH) 109 (H) 0 - 99 mg/dL   Chol/HDL Ratio 3.0 0.0 - 4.4 ratio  TSH   Collection Time: 01/31/23 10:44 AM  Result Value Ref Range   TSH 0.965 0.450 - 4.500 uIU/mL  HIV Antibody (routine testing w rflx)   Collection Time: 01/31/23 10:44 AM  Result Value Ref Range   HIV Screen 4th Generation wRfx Non Reactive Non Reactive      Assessment & Plan:   Problem List Items Addressed This Visit       Other   Attention deficit hyperactivity disorder (ADHD), predominantly inattentive type - Primary     Follow up plan: No follow-ups on file.

## 2023-08-23 ENCOUNTER — Telehealth: Payer: Self-pay

## 2023-08-23 NOTE — Telephone Encounter (Signed)
 Call has been returned to patient to schedule her colonoscopy.  LVM for pt to return my call.   Thanks, [place], [place]

## 2023-08-29 ENCOUNTER — Other Ambulatory Visit: Payer: Self-pay

## 2023-08-29 ENCOUNTER — Telehealth: Payer: Self-pay

## 2023-08-29 DIAGNOSIS — Z860100 Personal history of colon polyps, unspecified: Secondary | ICD-10-CM

## 2023-08-29 MED ORDER — NA SULFATE-K SULFATE-MG SULF 17.5-3.13-1.6 GM/177ML PO SOLN: 1.0000 | Freq: Once | ORAL | 0 refills | Status: AC

## 2023-08-29 NOTE — Telephone Encounter (Signed)
 Gastroenterology Pre-Procedure Review  Request Date: 10/26/23 Requesting Physician: Dr. Severa Daniels  PATIENT REVIEW QUESTIONS: The patient responded to the following health history questions as indicated:    1. Are you having any GI issues? no 2. Do you have a personal history of Polyps? yes (last colonoscopy performed with Dr. Luke Salaam 09/24/2020 recommended repeat in 3 years.) 3. Do you have a family history of Colon Cancer or Polyps? no 4. Diabetes Mellitus? no 5. Joint replacements in the past 12 months?no 6. Major health problems in the past 3 months?no 7. Any artificial heart valves, MVP, or defibrillator?no    MEDICATIONS & ALLERGIES:    Patient reports the following regarding taking any anticoagulation/antiplatelet therapy:   Plavix, Coumadin, Eliquis, Xarelto, Lovenox, Pradaxa, Brilinta, or Effient? no Aspirin? no  Patient confirms/reports the following medications:  Current Outpatient Medications  Medication Sig Dispense Refill   [START ON 10/08/2023] amphetamine -dextroamphetamine  (ADDERALL XR) 15 MG 24 hr capsule Take 1 capsule by mouth 2 (two) times daily. 60 capsule 0   [START ON 09/07/2023] amphetamine -dextroamphetamine  (ADDERALL XR) 15 MG 24 hr capsule Take 1 capsule by mouth 2 (two) times daily. 60 capsule 0   amphetamine -dextroamphetamine  (ADDERALL XR) 15 MG 24 hr capsule Take 1 capsule by mouth 2 (two) times daily. 60 capsule 0   gabapentin  (NEURONTIN ) 600 MG tablet Take 1 tablet (600 mg total) by mouth 2 (two) times daily. 180 tablet 1   Na Sulfate-K Sulfate-Mg Sulfate concentrate (SUPREP) 17.5-3.13-1.6 GM/177ML SOLN Take 1 kit (354 mLs total) by mouth once for 1 dose. 354 mL 0   sertraline  (ZOLOFT ) 100 MG tablet Take 2 tablets (200 mg total) by mouth daily. 180 tablet 1   omeprazole  (PRILOSEC) 20 MG capsule Take 1 capsule (20 mg total) by mouth daily. (Patient not taking: Reported on 08/29/2023) 90 capsule 1   valACYclovir  (VALTREX ) 500 MG tablet Take 1 tablet (500  mg total) by mouth daily. As needed (Patient not taking: Reported on 08/29/2023) 90 tablet 1   No current facility-administered medications for this visit.    Patient confirms/reports the following allergies:  No Known Allergies  No orders of the defined types were placed in this encounter.   AUTHORIZATION INFORMATION Primary Insurance: 1D#: Group #:  Secondary Insurance: 1D#: Group #:  SCHEDULE INFORMATION: Date: 10/26/23 Time: Location: [facility]

## 2023-10-12 ENCOUNTER — Other Ambulatory Visit: Payer: Self-pay | Admitting: Nurse Practitioner

## 2023-10-14 NOTE — Telephone Encounter (Signed)
 Requested Prescriptions  Pending Prescriptions Disp Refills   valACYclovir  (VALTREX ) 500 MG tablet [Pharmacy Med Name: VALACYCLOVIR  HCL 500 MG TABLET] 90 tablet 0    Sig: Take 1 tablet (500 mg total) by mouth daily. As needed     Antimicrobials:  Antiviral Agents - Anti-Herpetic Passed - 10/14/2023  4:52 PM      Passed - Valid encounter within last 12 months    Recent Outpatient Visits           2 months ago Attention deficit hyperactivity disorder (ADHD), predominantly inattentive type   [place] [facility] Melvin Pao, NP   3 months ago Gastroesophageal reflux disease with esophagitis and hemorrhage   [place] [facility] Melvin Pao, NP               sertraline  (ZOLOFT ) 100 MG tablet [Pharmacy Med Name: SERTRALINE  HCL 100 MG TABLET] 180 tablet 0    Sig: Take 2 tablets (200 mg total) by mouth daily.     Psychiatry:  Antidepressants - SSRI - sertraline  Failed - 10/14/2023  4:52 PM      Failed - ALT in normal range and within 360 days    ALT  Date Value Ref Range Status  01/31/2023 35 (H) 0 - 32 IU/L Final         Passed - AST in normal range and within 360 days    AST  Date Value Ref Range Status  01/31/2023 32 0 - 40 IU/L Final         Passed - Completed PHQ-2 or PHQ-9 in the last 360 days      Passed - Valid encounter within last 6 months    Recent Outpatient Visits           2 months ago Attention deficit hyperactivity disorder (ADHD), predominantly inattentive type   [place] [facility] Melvin Pao, NP   3 months ago Gastroesophageal reflux disease with esophagitis and hemorrhage   [place] [facility] Melvin Pao, NP

## 2023-10-26 ENCOUNTER — Encounter: Payer: Self-pay | Admitting: General Surgery

## 2023-10-26 ENCOUNTER — Encounter
Admission: RE | Disposition: A | Discharge status: Home / Self Care | Payer: Self-pay | Source: Home / Self Care | Attending: General Surgery

## 2023-10-26 ENCOUNTER — Ambulatory Visit: Admitting: Anesthesiology

## 2023-10-26 ENCOUNTER — Ambulatory Visit
Admission: RE | Admit: 2023-10-26 | Discharge: 2023-10-26 | Disposition: A | Discharge status: Home / Self Care | Attending: General Surgery | Admitting: General Surgery

## 2023-10-26 ENCOUNTER — Other Ambulatory Visit: Payer: Self-pay

## 2023-10-26 DIAGNOSIS — Z87891 Personal history of nicotine dependence: Secondary | ICD-10-CM | POA: Diagnosis not present

## 2023-10-26 DIAGNOSIS — Q438 Other specified congenital malformations of intestine: Secondary | ICD-10-CM | POA: Insufficient documentation

## 2023-10-26 DIAGNOSIS — Z1211 Encounter for screening for malignant neoplasm of colon: Secondary | ICD-10-CM | POA: Diagnosis not present

## 2023-10-26 DIAGNOSIS — Z860101 Personal history of adenomatous and serrated colon polyps: Secondary | ICD-10-CM | POA: Insufficient documentation

## 2023-10-26 DIAGNOSIS — Z09 Encounter for follow-up examination after completed treatment for conditions other than malignant neoplasm: Secondary | ICD-10-CM | POA: Insufficient documentation

## 2023-10-26 DIAGNOSIS — F32A Depression, unspecified: Secondary | ICD-10-CM | POA: Diagnosis not present

## 2023-10-26 DIAGNOSIS — F419 Anxiety disorder, unspecified: Secondary | ICD-10-CM | POA: Diagnosis not present

## 2023-10-26 DIAGNOSIS — Z860100 Personal history of colon polyps, unspecified: Secondary | ICD-10-CM | POA: Diagnosis not present

## 2023-10-26 SURGERY — COLONOSCOPY
Anesthesia: General

## 2023-10-26 MED ORDER — MIDAZOLAM HCL 2 MG/2ML IJ SOLN
INTRAMUSCULAR | Status: DC | PRN
  Administered 2023-10-26: .5 mg via INTRAVENOUS
  Administered 2023-10-26: 1.5 mg via INTRAVENOUS

## 2023-10-26 MED ORDER — PROPOFOL 500 MG/50ML IV EMUL
INTRAVENOUS | Status: DC | PRN
  Administered 2023-10-26: 75 ug/kg/min via INTRAVENOUS

## 2023-10-26 MED ORDER — EPHEDRINE SULFATE-NACL 50-0.9 MG/10ML-% IV SOSY
PREFILLED_SYRINGE | INTRAVENOUS | Status: DC | PRN
  Administered 2023-10-26 (×3): 5 mg via INTRAVENOUS
  Administered 2023-10-26: 10 mg via INTRAVENOUS

## 2023-10-26 MED ORDER — LIDOCAINE HCL (PF) 2 % IJ SOLN
INTRAMUSCULAR | Status: AC
  Filled 2023-10-26: qty 5

## 2023-10-26 MED ORDER — PROPOFOL 10 MG/ML IV BOLUS
INTRAVENOUS | Status: DC | PRN
  Administered 2023-10-26 (×4): 50 mg via INTRAVENOUS

## 2023-10-26 MED ORDER — SODIUM CHLORIDE 0.9 % IV SOLN: INTRAVENOUS | Status: DC

## 2023-10-26 MED ORDER — DEXMEDETOMIDINE HCL IN NACL 80 MCG/20ML IV SOLN
INTRAVENOUS | Status: DC | PRN
  Administered 2023-10-26 (×2): 20 ug via INTRAVENOUS

## 2023-10-26 MED ORDER — EPHEDRINE 5 MG/ML INJ
INTRAVENOUS | Status: AC
  Filled 2023-10-26: qty 5

## 2023-10-26 MED ORDER — LIDOCAINE HCL (CARDIAC) PF 100 MG/5ML IV SOSY
PREFILLED_SYRINGE | INTRAVENOUS | Status: DC | PRN
  Administered 2023-10-26: 40 mg via INTRAVENOUS

## 2023-10-26 NOTE — Anesthesia Postprocedure Evaluation (Signed)
 Anesthesia Post Note  Patient: Chelsea Velez  Procedure(s) Performed: COLONOSCOPY  Patient location during evaluation: Endoscopy Anesthesia Type: General Level of consciousness: awake and alert Pain management: pain level controlled Vital Signs Assessment: post-procedure vital signs reviewed and stable Respiratory status: spontaneous breathing, nonlabored ventilation, respiratory function stable and patient connected to nasal cannula oxygen Cardiovascular status: blood pressure returned to baseline and stable Postop Assessment: no apparent nausea or vomiting Anesthetic complications: no   No notable events documented.   Last Vitals:  Vitals:   10/26/23 1038 10/26/23 1039  BP: 91/79 (!) 103/53  Pulse: 69 68  Resp: 16 18  Temp:    SpO2: 98% 99%    Last Pain:  Vitals:   10/26/23 1039  TempSrc:   PainSc: 0-No pain                 Prentice Murphy

## 2023-10-26 NOTE — Op Note (Signed)
 [facility] Gastroenterology Patient Name: Chelsea Velez Procedure Date: 10/26/2023 8:59 AM MRN: [id number] Account #: [id number] Date of Birth: 04-20-1965 Admit Type: Outpatient Age: 58 Room: [facility] ENDO ROOM 1 Gender: Female Note Status: Finalized Instrument Name: Arvis [id number] Procedure:             Colonoscopy Indications:           Surveillance: Personal history of adenomatous polyps                         on last colonoscopy 3 years ago Providers:             Jayson KIDD. Marinda, MD Referring MD:          Jayson KIDD. Marinda, MD (Referring MD), Darice Petty                         (Referring MD) Medicines:             See the Anesthesia note for documentation of the                         administered medications Procedure:             Pre-Anesthesia Assessment:                        - Prior to the procedure, a History and Physical was                         performed, and patient medications and allergies were                         reviewed. The patient's tolerance of previous                         anesthesia was also reviewed. The risks and benefits                         of the procedure and the sedation options and risks                         were discussed with the patient. All questions were                         answered, and informed consent was obtained. Prior                         Anticoagulants: The patient has taken no anticoagulant                         or antiplatelet agents. ASA Grade Assessment: II - A                         patient with mild systemic disease. After reviewing                         the risks and benefits, the patient was deemed in  satisfactory condition to undergo the procedure.                        After obtaining informed consent, the colonoscope was                         passed under direct vision. Throughout the procedure,                         the patient's blood  pressure, pulse, and oxygen                         saturations were monitored continuously. The                         Colonoscope was introduced through the anus and                         advanced to the the cecum, identified by the                         appendiceal orifice. The colonoscopy was somewhat                         difficult due to significant looping. Successful                         completion of the procedure was aided by withdrawing                         the scope and replacing with the pediatric endoscope.                         The patient tolerated the procedure well. The quality                         of the bowel preparation was good. Findings:      The perianal and digital rectal examinations were normal.      The in the colon revealed excessive looping. Estimated blood loss: none.      The entire examined colon appeared normal on direct and retroflexion       views. Impression:            - There was significant looping of the colon.                        - The entire examined colon is normal on direct and                         retroflexion views.                        - No specimens collected. Recommendation:        - Discharge patient to home (ambulatory).                        - Repeat colonoscopy in 5 years for surveillance of  multiple adenomas. Procedure Code(s):     --- Professional ---                        [phone], Colonoscopy, flexible; diagnostic, including                         collection of specimen(s) by brushing or washing, when                         performed (separate procedure) CPT copyright 2022 American Medical Association. All rights reserved. The codes documented in this report are preliminary and upon coder review may  be revised to meet current compliance requirements. Jayson MALVA Endow, MD 10/26/2023 10:20:12 AM Number of Addenda: 0 Note Initiated On: 10/26/2023 8:59 AM Scope Withdrawal Time: 0  hours 15 minutes 52 seconds  Total Procedure Duration: 0 hours 57 minutes 36 seconds  Estimated Blood Loss:  Estimated blood loss was minimal.      [facility]

## 2023-10-26 NOTE — H&P (Signed)
 Primary Care Physician:  Melvin Pao, NP Primary Gastroenterologist:  Dr. Marinda  Pre-Procedure History & Physical: HPI:  Chelsea Velez is a 58 y.o. female is here for an colonoscopy.   Past Medical History:  Diagnosis Date   ADHD    Anxiety    Arthritis    hands - tx with otc meds prn   Depression    Foot fracture, left     Past Surgical History:  Procedure Laterality Date   BLADDER SUSPENSION  08/04/2011   Procedure: TRANSVAGINAL TAPE (TVT) PROCEDURE;  Surgeon: Rosaline DELENA Luna, MD;  Location: [facility];  Service: Gynecology;  Laterality: N/A;   BLADDER SUSPENSION N/A    BREAST SURGERY     Right breast excisional biopsy   CESAREAN SECTION  1995   COLONOSCOPY N/A 09/24/2020   Procedure: COLONOSCOPY;  Surgeon: Therisa Bi, MD;  Location: [facility] ENDOSCOPY;  Service: Gastroenterology;  Laterality: N/A;   CYSTOSCOPY  08/04/2011   Procedure: CYSTOSCOPY;  Surgeon: Rosaline DELENA Luna, MD;  Location: [facility] ORS;  Service: Gynecology;  Laterality: N/A;   endosocpy     EYE SURGERY     [facility] Teeth Ext      Prior to Admission medications   Medication Sig Start Date End Date Taking? Authorizing Provider  amphetamine -dextroamphetamine  (ADDERALL XR) 15 MG 24 hr capsule Take 1 capsule by mouth 2 (two) times daily. 10/08/23  Yes Melvin Pao, NP  gabapentin  (NEURONTIN ) 600 MG tablet Take 1 tablet (600 mg total) by mouth 2 (two) times daily. 04/25/23  Yes Melvin Pao, NP  sertraline  (ZOLOFT ) 100 MG tablet Take 2 tablets (200 mg total) by mouth daily. 10/14/23  Yes Melvin Pao, NP  valACYclovir  (VALTREX ) 500 MG tablet Take 1 tablet (500 mg total) by mouth daily. As needed 10/14/23  Yes Melvin Pao, NP  amphetamine -dextroamphetamine  (ADDERALL XR) 15 MG 24 hr capsule Take 1 capsule by mouth 2 (two) times daily. 09/07/23   Melvin Pao, NP  amphetamine -dextroamphetamine  (ADDERALL XR) 15 MG 24 hr capsule Take 1 capsule by mouth 2 (two) times daily. 08/08/23   Melvin Pao,  NP  omeprazole  (PRILOSEC) 20 MG capsule Take 1 capsule (20 mg total) by mouth daily. Patient not taking: Reported on 08/29/2023 07/04/23   Melvin Pao, NP    Allergies as of 08/29/2023   (No Known Allergies)    Family History  Adopted: Yes  Problem Relation Age of Onset   Cervical cancer Mother    Breast cancer Mother    Diabetes Mother    Hypertension Mother    Clotting disorder Mother    Breast cancer Maternal Aunt     Social History   Socioeconomic History   Marital status: Single    Spouse name: Not on file   Number of children: Not on file   Years of education: Not on file   Highest education level: Some college, no degree  Occupational History   Not on file  Tobacco Use   Smoking status: Former    Current packs/day: 0.00    Average packs/day: 0.3 packs/day for 8.0 years (2.0 ttl pk-yrs)    Types: Cigarettes    Start date: 09/10/1984    Quit date: 09/10/1992    Years since quitting: 31.1   Smokeless tobacco: Never  Vaping Use   Vaping status: Never Used  Substance and Sexual Activity   Alcohol use: Yes    Comment: socially   Drug use: Yes    Types: Marijuana, Amphetamines    Comment: takes Adderal  Sexual activity: Not Currently    Birth control/protection: None  Other Topics Concern   Not on file  Social History Narrative   Not on file   Social Drivers of Health   Financial Resource Strain: Medium Risk (09/26/2022)   Overall Financial Resource Strain (CARDIA)    Difficulty of Paying Living Expenses: Somewhat hard  Food Insecurity: No Food Insecurity (09/26/2022)   Hunger Vital Sign    Worried About Running Out of Food in the Last Year: Never true    Ran Out of Food in the Last Year: Never true  Transportation Needs: No Transportation Needs (09/26/2022)   PRAPARE - Administrator, Civil Service (Medical): No    Lack of Transportation (Non-Medical): No  Physical Activity: Sufficiently Active (09/26/2022)   Exercise Vital Sign    Days of  Exercise per Week: 5 days    Minutes of Exercise per Session: 120 min  Stress: Stress Concern Present (09/26/2022)   [employer] of Occupational Health - Occupational Stress Questionnaire    Feeling of Stress : To some extent  Social Connections: Socially Isolated (09/26/2022)   Social Connection and Isolation Panel    Frequency of Communication with Friends and Family: More than three times a week    Frequency of Social Gatherings with Friends and Family: Once a week    Attends Religious Services: Never    Database administrator or Organizations: No    Attends Engineer, structural: Not on file    Marital Status: Never married  Catering manager Violence: Not on file    Review of Systems: See HPI, otherwise negative ROS  Physical Exam: BP (!) 152/63   Pulse 82   Temp (!) 97.1 F (36.2 C) (Temporal)   Wt 56.7 kg   LMP 10/25/2011   SpO2 100%   BMI 19.57 kg/m  General:   Alert,  pleasant and cooperative in NAD Head:  Normocephalic and atraumatic. Neck:  Supple; no masses or thyromegaly. Lungs:  Clear throughout to auscultation.    Heart:  Regular rate and rhythm. Abdomen:  Soft, nontender and nondistended. Normal bowel sounds, without guarding, and without rebound.   Neurologic:  Alert and  oriented x4;  grossly normal neurologically.  Impression/Plan: Chelsea Velez is here for an colonoscopy to be performed for screening, history of polyps  Risks, benefits, limitations, and alternatives regarding  colonoscopy have been reviewed with the patient.  Questions have been answered.  All parties agreeable.   Jayson MALVA Endow, MD  10/26/2023, 8:54 AM

## 2023-10-26 NOTE — Anesthesia Preprocedure Evaluation (Signed)
 Anesthesia Evaluation  Patient identified by MRN, date of birth, ID band Patient awake    Reviewed: Allergy & Precautions, NPO status , Patient's Chart, lab work & pertinent test results  History of Anesthesia Complications Negative for: history of anesthetic complications  Airway Mallampati: II  TM Distance: >3 FB Neck ROM: Full    Dental  (+) Dental Advidsory Given, Teeth Intact   Pulmonary neg pulmonary ROS, Patient abstained from smoking., former smoker   Pulmonary exam normal        Cardiovascular negative cardio ROS Normal cardiovascular exam     Neuro/Psych  PSYCHIATRIC DISORDERS Anxiety Depression    negative neurological ROS     GI/Hepatic negative GI ROS, Neg liver ROS, Bowel prep,,,  Endo/Other  negative endocrine ROS    Renal/GU negative Renal ROS  negative genitourinary   Musculoskeletal  (+) Arthritis ,    Abdominal   Peds negative pediatric ROS (+)  Hematology negative hematology ROS (+)   Anesthesia Other Findings ADHD    Anxiety    Arthritis  hands - tx with otc meds prn  Depression       Reproductive/Obstetrics negative OB ROS                              Anesthesia Physical Anesthesia Plan  ASA: 2  Anesthesia Plan: General   Post-op Pain Management:    Induction: Intravenous  PONV Risk Score and Plan: 3 and Propofol  infusion and TIVA  Airway Management Planned: Natural Airway and Nasal Cannula  Additional Equipment:   Intra-op Plan:   Post-operative Plan:   Informed Consent: I have reviewed the patients History and Physical, chart, labs and discussed the procedure including the risks, benefits and alternatives for the proposed anesthesia with the patient or authorized representative who has indicated his/her understanding and acceptance.       Plan Discussed with: CRNA, Anesthesiologist and Surgeon  Anesthesia Plan Comments:           Anesthesia Quick Evaluation

## 2023-10-26 NOTE — Discharge Instructions (Signed)
YOU HAD AN ENDOSCOPIC PROCEDURE TODAY: Refer to the procedure report that was given to you for any specific questions about what was found during the examination.  If the procedure report does not answer your questions, please call your gastroenterologist to clarify. ° °YOU SHOULD EXPECT: Some feelings of bloating in the abdomen. Passage of more gas than usual.  Walking can help get rid of the air that was put into your GI tract during the procedure and reduce the bloating. If you had a lower endoscopy (such as a colonoscopy or flexible sigmoidoscopy) you may notice spotting of blood in your stool or on the toilet paper.  ° °DIET: Your first meal following the procedure should be a light meal and then it is ok to progress to your normal diet.  A half-sandwich or bowl of soup is an example of a good first meal.  Heavy or fried foods are harder to digest and may make you feel nasueas or bloated.  Drink plenty of fluids but you should avoid alcoholic beverages for 24 hours. ° °ACTIVITY: Your care partner should take you home directly after the procedure.  You should plan to take it easy, moving slowly for the rest of the day.  You can resume normal activity the day after the procedure however you should NOT DRIVE, make legal decisions or use heavy machinery for 24 hours (because of the sedation medicines used during the test).   ° °SYMPTOMS TO REPORT IMMEDIATELY  °A gastroenterologist can be reached at any hour.  Please call your doctor's office for any of the following symptoms: ° °· Following lower endoscopy (colonoscopy, flexible sigmoidoscopy) ° Excessive amounts of blood in the stool ° Significant tenderness, worsening of abdominal pains ° Swelling of the abdomen that is new, acute ° Fever of 100° or higher °· Following upper endoscopy (EGD, EUS, ERCP) ° Vomiting of blood or coffee ground material ° New, significant abdominal pain ° New, significant chest pain or pain under the shoulder blades ° Painful or  persistently difficult swallowing ° New shortness of breath ° Black, tarry-looking stools ° °FOLLOW UP: °If any biopsies were taken you will be contacted by phone or by letter within the next 1-3 weeks.  Call your gastroenterologist if you have not heard about the biopsies in 3 weeks.  ° °Please also call your gastroenterologist's office with any specific questions about appointments or follow up tests. °

## 2023-10-26 NOTE — Transfer of Care (Signed)
 Immediate Anesthesia Transfer of Care Note  Patient: Chelsea Velez  Procedure(s) Performed: COLONOSCOPY  Patient Location: PACU  Anesthesia Type:General  Level of Consciousness: sedated  Airway & Oxygen Therapy: Patient Spontanous Breathing  Post-op Assessment: Report given to RN  Post vital signs: Reviewed and stable  Last Vitals:  Vitals Value Taken Time  BP    Temp    Pulse    Resp    SpO2      Last Pain:  Vitals:   10/26/23 0831  TempSrc: Temporal  PainSc: 0-No pain         Complications: No notable events documented.

## 2023-11-01 ENCOUNTER — Other Ambulatory Visit: Payer: Self-pay | Admitting: Nurse Practitioner

## 2023-11-01 NOTE — Telephone Encounter (Unsigned)
 Copied from CRM [id number]. Topic: Clinical - Medication Refill >> Nov 01, 2023 12:06 PM Shardie S wrote: Medication: amphetamine -dextroamphetamine  (ADDERALL XR) 15 MG 24 hr capsule Medication   Has the patient contacted their pharmacy? Yes (Agent: If no, request that the patient contact the pharmacy for the refill. If patient does not wish to contact the pharmacy document the reason why and proceed with request.) (Agent: If yes, when and what did the pharmacy advise?)  This is the patient's preferred pharmacy:  [facility] - [place], [place] - [address] [place] [address] Phone: [phone] Fax: [phone]  Is this the correct pharmacy for this prescription? Yes If no, delete pharmacy and type the correct one.   Has the prescription been filled recently? No  Is the patient out of the medication? Yes  Has the patient been seen for an appointment in the last year OR does the patient have an upcoming appointment? Yes  Can we respond through MyChart? Yes  Agent: Please be advised that Rx refills may take up to 3 business days. We ask that you follow-up with your pharmacy.

## 2023-11-03 MED ORDER — AMPHETAMINE-DEXTROAMPHET ER 15 MG PO CP24: 15.0000 mg | ORAL_CAPSULE | Freq: Two times a day (BID) | ORAL | 0 refills | Status: DC

## 2023-11-03 NOTE — Telephone Encounter (Signed)
 Requested medications are due for refill today.   A little too soon  Requested medications are on the active medications list.  yes  Last refill. 10/08/2023 #60 0 rf  Future visit scheduled.   yes  Notes to clinic.  Refill not delegated.    Requested Prescriptions  Pending Prescriptions Disp Refills   amphetamine -dextroamphetamine  (ADDERALL XR) 15 MG 24 hr capsule 60 capsule 0    Sig: Take 1 capsule by mouth 2 (two) times daily.     Not Delegated - Psychiatry:  Stimulants/ADHD Failed - 11/03/2023  1:19 PM      Failed - This refill cannot be delegated      Failed - Urine Drug Screen completed in last 360 days      Passed - Last BP in normal range    BP Readings from Last 1 Encounters:  10/26/23 (!) 103/53         Passed - Last Heart Rate in normal range    Pulse Readings from Last 1 Encounters:  10/26/23 68         Passed - Valid encounter within last 6 months    Recent Outpatient Visits           2 months ago Attention deficit hyperactivity disorder (ADHD), predominantly inattentive type   [place] [facility] Melvin Pao, NP   4 months ago Gastroesophageal reflux disease with esophagitis and hemorrhage   [facility] Melvin Pao, NP

## 2023-11-07 ENCOUNTER — Ambulatory Visit: Admitting: Nurse Practitioner

## 2023-11-08 ENCOUNTER — Encounter: Payer: Self-pay | Admitting: Nurse Practitioner

## 2023-11-08 ENCOUNTER — Ambulatory Visit: Admitting: Nurse Practitioner

## 2023-11-08 VITALS — BP 128/72 | HR 62 | Temp 98.1°F | Ht 67.0 in | Wt 128.4 lb

## 2023-11-08 DIAGNOSIS — M8589 Other specified disorders of bone density and structure, multiple sites: Secondary | ICD-10-CM | POA: Diagnosis not present

## 2023-11-08 DIAGNOSIS — F3342 Major depressive disorder, recurrent, in full remission: Secondary | ICD-10-CM

## 2023-11-08 DIAGNOSIS — F900 Attention-deficit hyperactivity disorder, predominantly inattentive type: Secondary | ICD-10-CM | POA: Diagnosis not present

## 2023-11-08 DIAGNOSIS — M8580 Other specified disorders of bone density and structure, unspecified site: Secondary | ICD-10-CM | POA: Insufficient documentation

## 2023-11-08 MED ORDER — AMPHETAMINE-DEXTROAMPHET ER 15 MG PO CP24: 15.0000 mg | ORAL_CAPSULE | Freq: Two times a day (BID) | ORAL | 0 refills | Status: DC

## 2023-11-08 NOTE — Assessment & Plan Note (Signed)
Chronic.  Controlled.  Continue with current medication regimen.  Return to clinic in 6 months for reevaluation.  Call sooner if concerns arise.  ? ?

## 2023-11-08 NOTE — Patient Instructions (Signed)
 Please call to schedule your mammogram and/or bone density: [facility]  Address: [address], [place], [place] [address] Phone: [phone]  [facility] [address] [place],  [place]  [address] Phone: [phone]

## 2023-11-08 NOTE — Assessment & Plan Note (Signed)
 Chronic. Controlled.  Continue with Adderall to 15mg  BID.  Follow up in 3 month.  PDMP checked during visit.  Refills sent today.  Call sooner if concerns arise.

## 2023-11-08 NOTE — Progress Notes (Signed)
 BP 128/72 (BP Location: Left Arm, Cuff Size: Normal)   Pulse 62   Temp 98.1 F (36.7 C) (Oral)   Ht 5' 7 (1.702 m)   Wt 128 lb 6.4 oz (58.2 kg)   LMP 10/25/2011   SpO2 97%   BMI 20.11 kg/m    Subjective:    Patient ID: Chelsea Velez, female    DOB: 12/21/1965, 57 y.o.   MRN: [id number]  HPI: EDLA PARA is a 58 y.o. female  Chief Complaint  Patient presents with   ADHD   Depression   ADHD FOLLOW UP Pateint states she is having difficulty getting both doses of the medication.  She would like to get 2 15mg  doses to make it easier.   ADHD status: controlled Satisfied with current therapy: yes Medication compliance:  excellent compliance Controlled substance contract: yes Previous psychiatry evaluation: yes Previous medications: yes adderall XR   Taking meds on weekends/vacations: yes Work/school performance:  excellent Difficulty sustaining attention/completing tasks: no Distracted by extraneous stimuli: no Does not listen when spoken to: no  Fidgets with hands or feet: no Unable to stay in seat: no Blurts out/interrupts others: no ADHD Medication Side Effects: no    Decreased appetite: no    Headache: no    Sleeping disturbance pattern: no    Irritability: no    Rebound effects (worse than baseline) off medication: no    Anxiousness: no    Dizziness: no    Tics: no  Patient states she is having teeth break off.  Dentist feels like it is related to her bone density.  She only eats soft food but the tooth broke with broccoli.    Relevant past medical, surgical, family and social history reviewed and updated as indicated. Interim medical history since our last visit reviewed. Allergies and medications reviewed and updated.  Review of Systems  Constitutional:  Negative for appetite change.  HENT:  Positive for dental problem.   Neurological:  Negative for dizziness.  Psychiatric/Behavioral:  Positive for decreased concentration. Negative for agitation  and sleep disturbance. The patient is not nervous/anxious.     Per HPI unless specifically indicated above     Objective:    BP 128/72 (BP Location: Left Arm, Cuff Size: Normal)   Pulse 62   Temp 98.1 F (36.7 C) (Oral)   Ht 5' 7 (1.702 m)   Wt 128 lb 6.4 oz (58.2 kg)   LMP 10/25/2011   SpO2 97%   BMI 20.11 kg/m   Wt Readings from Last 3 Encounters:  11/08/23 128 lb 6.4 oz (58.2 kg)  10/26/23 125 lb (56.7 kg)  08/08/23 121 lb 9.6 oz (55.2 kg)    Physical Exam Vitals and nursing note reviewed.  Constitutional:      General: She is not in acute distress.    Appearance: Normal appearance. She is normal weight. She is not ill-appearing, toxic-appearing or diaphoretic.  HENT:     Head: Normocephalic.     Right Ear: External ear normal.     Left Ear: External ear normal.     Nose: Nose normal.     Mouth/Throat:     Mouth: Mucous membranes are moist.     Pharynx: Oropharynx is clear.  Eyes:     General:        Right eye: No discharge.        Left eye: No discharge.     Extraocular Movements: Extraocular movements intact.     Conjunctiva/sclera: Conjunctivae normal.  Pupils: Pupils are equal, round, and reactive to light.  Cardiovascular:     Rate and Rhythm: Normal rate and regular rhythm.     Heart sounds: No murmur heard. Pulmonary:     Effort: Pulmonary effort is normal. No respiratory distress.     Breath sounds: Normal breath sounds. No wheezing or rales.  Musculoskeletal:     Cervical back: Normal range of motion and neck supple.  Skin:    General: Skin is warm and dry.     Capillary Refill: Capillary refill takes less than 2 seconds.  Neurological:     General: No focal deficit present.     Mental Status: She is alert and oriented to person, place, and time. Mental status is at baseline.  Psychiatric:        Mood and Affect: Mood normal.        Behavior: Behavior normal.        Thought Content: Thought content normal.        Judgment: Judgment normal.      Results for orders placed or performed in visit on 01/31/23  Urinalysis, Routine w reflex microscopic   Collection Time: 01/31/23 10:37 AM  Result Value Ref Range   Specific Gravity, UA 1.025 1.005 - 1.030   pH, UA 6.0 5.0 - 7.5   Color, UA Yellow Yellow   Appearance Ur Clear Clear   Leukocytes,UA Negative Negative   Protein,UA Negative Negative/Trace   Glucose, UA Negative Negative   Ketones, UA Negative Negative   RBC, UA Negative Negative   Bilirubin, UA Negative Negative   Urobilinogen, Ur 0.2 0.2 - 1.0 mg/dL   Nitrite, UA Negative Negative   Microscopic Examination Comment   CBC with Differential/Platelet   Collection Time: 01/31/23 10:44 AM  Result Value Ref Range   WBC 5.2 3.4 - 10.8 x10E3/uL   RBC 4.08 3.77 - 5.28 x10E6/uL   Hemoglobin 13.8 11.1 - 15.9 g/dL   Hematocrit 57.7 65.9 - 46.6 %   MCV 103 (H) 79 - 97 fL   MCH 33.8 (H) 26.6 - 33.0 pg   MCHC 32.7 31.5 - 35.7 g/dL   RDW 88.1 88.2 - 84.5 %   Platelets 231 150 - 450 x10E3/uL   Neutrophils 58 Not Estab. %   Lymphs 28 Not Estab. %   Monocytes 11 Not Estab. %   Eos 2 Not Estab. %   Basos 1 Not Estab. %   Neutrophils Absolute 3.0 1.4 - 7.0 x10E3/uL   Lymphocytes Absolute 1.4 0.7 - 3.1 x10E3/uL   Monocytes Absolute 0.6 0.1 - 0.9 x10E3/uL   EOS (ABSOLUTE) 0.1 0.0 - 0.4 x10E3/uL   Basophils Absolute 0.0 0.0 - 0.2 x10E3/uL   Immature Granulocytes 0 Not Estab. %   Immature Grans (Abs) 0.0 0.0 - 0.1 x10E3/uL  Comprehensive metabolic panel   Collection Time: 01/31/23 10:44 AM  Result Value Ref Range   Glucose 91 70 - 99 mg/dL   BUN 12 6 - 24 mg/dL   Creatinine, Ser 9.24 0.57 - 1.00 mg/dL   eGFR 93 >40 fO/fpw/8.26   BUN/Creatinine Ratio 16 9 - 23   Sodium 139 134 - 144 mmol/L   Potassium 3.7 3.5 - 5.2 mmol/L   Chloride 100 96 - 106 mmol/L   CO2 26 20 - 29 mmol/L   Calcium 9.5 8.7 - 10.2 mg/dL   Total Protein 6.7 6.0 - 8.5 g/dL   Albumin 4.5 3.8 - 4.9 g/dL   Globulin, Total 2.2 1.5 - 4.5  g/dL    Bilirubin Total 0.3 0.0 - 1.2 mg/dL   Alkaline Phosphatase 62 44 - 121 IU/L   AST 32 0 - 40 IU/L   ALT 35 (H) 0 - 32 IU/L  Lipid panel   Collection Time: 01/31/23 10:44 AM  Result Value Ref Range   Cholesterol, Total 182 100 - 199 mg/dL   Triglycerides 76 0 - 149 mg/dL   HDL 61 >60 mg/dL   VLDL Cholesterol Cal 14 5 - 40 mg/dL   LDL Chol Calc (NIH) 892 (H) 0 - 99 mg/dL   Chol/HDL Ratio 3.0 0.0 - 4.4 ratio  TSH   Collection Time: 01/31/23 10:44 AM  Result Value Ref Range   TSH 0.965 0.450 - 4.500 uIU/mL  HIV Antibody (routine testing w rflx)   Collection Time: 01/31/23 10:44 AM  Result Value Ref Range   HIV Screen 4th Generation wRfx Non Reactive Non Reactive      Assessment & Plan:   Problem List Items Addressed This Visit       Musculoskeletal and Integument   Osteopenia - Primary   Last Bone Denist was 2023.  Ordered updated Bone Density scan.  She is taking Vitamin D with Calcium.  Will make further recommendations based on results.      Relevant Orders   DG Bone Density     Other   Attention deficit hyperactivity disorder (ADHD), predominantly inattentive type   Chronic. Controlled.  Continue with Adderall to 15mg  BID.  Follow up in 3 month.  PDMP checked during visit.  Refills sent today.  Call sooner if concerns arise.       Major depressive disorder   Chronic.  Controlled.  Continue with current medication regimen.  Return to clinic in 6 months for reevaluation.  Call sooner if concerns arise.          Follow up plan: Return in about 3 months (around 02/08/2024) for ADHD FU.

## 2023-11-08 NOTE — Assessment & Plan Note (Signed)
 Last Bone Denist was 2023.  Ordered updated Bone Density scan.  She is taking Vitamin D with Calcium.  Will make further recommendations based on results.

## 2023-12-05 ENCOUNTER — Ambulatory Visit: Admitting: Nurse Practitioner

## 2024-01-24 ENCOUNTER — Other Ambulatory Visit: Payer: Self-pay | Admitting: Nurse Practitioner

## 2024-01-26 NOTE — Telephone Encounter (Signed)
 Requested Prescriptions  Pending Prescriptions Disp Refills   sertraline  (ZOLOFT ) 100 MG tablet [Pharmacy Med Name: SERTRALINE  HCL 100 MG TABLET] 180 tablet 0    Sig: Take 2 tablets (200 mg total) by mouth daily.     Psychiatry:  Antidepressants - SSRI - sertraline  Failed - 01/26/2024 12:45 PM      Failed - ALT in normal range and within 360 days    ALT  Date Value Ref Range Status  01/31/2023 35 (H) 0 - 32 IU/L Final         Passed - AST in normal range and within 360 days    AST  Date Value Ref Range Status  01/31/2023 32 0 - 40 IU/L Final         Passed - Completed PHQ-2 or PHQ-9 in the last 360 days      Passed - Valid encounter within last 6 months    Recent Outpatient Visits           2 months ago Osteopenia of multiple sites   [facility] Melvin Pao, NP   5 months ago Attention deficit hyperactivity disorder (ADHD), predominantly inattentive type   [facility] Melvin Pao, NP   6 months ago Gastroesophageal reflux disease with esophagitis and hemorrhage   [place] [facility] Melvin Pao, NP

## 2024-02-02 ENCOUNTER — Other Ambulatory Visit: Payer: Self-pay | Admitting: Nurse Practitioner

## 2024-02-02 DIAGNOSIS — Z1231 Encounter for screening mammogram for malignant neoplasm of breast: Secondary | ICD-10-CM

## 2024-02-06 ENCOUNTER — Encounter: Payer: Self-pay | Admitting: Nurse Practitioner

## 2024-02-06 ENCOUNTER — Ambulatory Visit: Admitting: Nurse Practitioner

## 2024-02-06 VITALS — BP 121/68 | HR 65 | Temp 97.9°F | Ht 67.0 in | Wt 125.8 lb

## 2024-02-06 DIAGNOSIS — F3342 Major depressive disorder, recurrent, in full remission: Secondary | ICD-10-CM

## 2024-02-06 DIAGNOSIS — M8589 Other specified disorders of bone density and structure, multiple sites: Secondary | ICD-10-CM | POA: Diagnosis not present

## 2024-02-06 DIAGNOSIS — Z79899 Other long term (current) drug therapy: Secondary | ICD-10-CM

## 2024-02-06 DIAGNOSIS — F900 Attention-deficit hyperactivity disorder, predominantly inattentive type: Secondary | ICD-10-CM

## 2024-02-06 MED ORDER — AMPHETAMINE-DEXTROAMPHET ER 15 MG PO CP24: 15.0000 mg | ORAL_CAPSULE | Freq: Two times a day (BID) | ORAL | 0 refills | Status: DC

## 2024-02-06 NOTE — Assessment & Plan Note (Signed)
 Chronic. Controlled.  Continue with Adderall to 15mg  BID.  Follow up in 3 month.  PDMP checked during visit.  UDS and controlled substance agreement updated at visit today.  Refills sent today.  Call sooner if concerns arise.

## 2024-02-06 NOTE — Progress Notes (Signed)
 BP 121/68   Pulse 65   Temp 97.9 F (36.6 C) (Oral)   Ht 5' 7 (1.702 m)   Wt 125 lb 12.8 oz (57.1 kg)   LMP 10/25/2011   SpO2 97%   BMI 19.70 kg/m    Subjective:    Patient ID: Chelsea Velez, female    DOB: February 02, 1966, 58 y.o.   MRN: [id number]  HPI: Chelsea Velez is a 58 y.o. female  Chief Complaint  Patient presents with   ADHD   Depression   ADHD FOLLOW UP Pateint states she is having difficulty getting both doses of the medication.  She would like to get 2 15mg  doses to make it easier.   ADHD status: controlled Satisfied with current therapy: yes Medication compliance:  excellent compliance Controlled substance contract: yes Previous psychiatry evaluation: yes Previous medications: yes adderall XR   Taking meds on weekends/vacations: yes Work/school performance:  excellent Difficulty sustaining attention/completing tasks: no Distracted by extraneous stimuli: no Does not listen when spoken to: no  Fidgets with hands or feet: no Unable to stay in seat: no Blurts out/interrupts others: no ADHD Medication Side Effects: no    Decreased appetite: no    Headache: no    Sleeping disturbance pattern: no    Irritability: no    Rebound effects (worse than baseline) off medication: no    Anxiousness: no    Dizziness: no    Tics: no  Patient states she is no longer taking the gabapentin  for her itching.  She has been doing okay without it.      Relevant past medical, surgical, family and social history reviewed and updated as indicated. Interim medical history since our last visit reviewed. Allergies and medications reviewed and updated.  Review of Systems  Constitutional:  Negative for appetite change.  Neurological:  Negative for dizziness.  Psychiatric/Behavioral:  Positive for decreased concentration. Negative for agitation and sleep disturbance. The patient is not nervous/anxious.     Per HPI unless specifically indicated above     Objective:     BP 121/68   Pulse 65   Temp 97.9 F (36.6 C) (Oral)   Ht 5' 7 (1.702 m)   Wt 125 lb 12.8 oz (57.1 kg)   LMP 10/25/2011   SpO2 97%   BMI 19.70 kg/m   Wt Readings from Last 3 Encounters:  02/06/24 125 lb 12.8 oz (57.1 kg)  11/08/23 128 lb 6.4 oz (58.2 kg)  10/26/23 125 lb (56.7 kg)    Physical Exam Vitals and nursing note reviewed.  Constitutional:      General: She is not in acute distress.    Appearance: Normal appearance. She is normal weight. She is not ill-appearing, toxic-appearing or diaphoretic.  HENT:     Head: Normocephalic.     Right Ear: External ear normal.     Left Ear: External ear normal.     Nose: Nose normal.     Mouth/Throat:     Mouth: Mucous membranes are moist.     Pharynx: Oropharynx is clear.  Eyes:     General:        Right eye: No discharge.        Left eye: No discharge.     Extraocular Movements: Extraocular movements intact.     Conjunctiva/sclera: Conjunctivae normal.     Pupils: Pupils are equal, round, and reactive to light.  Cardiovascular:     Rate and Rhythm: Normal rate and regular rhythm.  Heart sounds: No murmur heard. Pulmonary:     Effort: Pulmonary effort is normal. No respiratory distress.     Breath sounds: Normal breath sounds. No wheezing or rales.  Musculoskeletal:     Cervical back: Normal range of motion and neck supple.  Skin:    General: Skin is warm and dry.     Capillary Refill: Capillary refill takes less than 2 seconds.  Neurological:     General: No focal deficit present.     Mental Status: She is alert and oriented to person, place, and time. Mental status is at baseline.  Psychiatric:        Mood and Affect: Mood normal.        Behavior: Behavior normal.        Thought Content: Thought content normal.        Judgment: Judgment normal.     Results for orders placed or performed in visit on 01/31/23  Urinalysis, Routine w reflex microscopic   Collection Time: 01/31/23 10:37 AM  Result Value Ref  Range   Specific Gravity, UA 1.025 1.005 - 1.030   pH, UA 6.0 5.0 - 7.5   Color, UA Yellow Yellow   Appearance Ur Clear Clear   Leukocytes,UA Negative Negative   Protein,UA Negative Negative/Trace   Glucose, UA Negative Negative   Ketones, UA Negative Negative   RBC, UA Negative Negative   Bilirubin, UA Negative Negative   Urobilinogen, Ur 0.2 0.2 - 1.0 mg/dL   Nitrite, UA Negative Negative   Microscopic Examination Comment   CBC with Differential/Platelet   Collection Time: 01/31/23 10:44 AM  Result Value Ref Range   WBC 5.2 3.4 - 10.8 x10E3/uL   RBC 4.08 3.77 - 5.28 x10E6/uL   Hemoglobin 13.8 11.1 - 15.9 g/dL   Hematocrit 57.7 65.9 - 46.6 %   MCV 103 (H) 79 - 97 fL   MCH 33.8 (H) 26.6 - 33.0 pg   MCHC 32.7 31.5 - 35.7 g/dL   RDW 88.1 88.2 - 84.5 %   Platelets 231 150 - 450 x10E3/uL   Neutrophils 58 Not Estab. %   Lymphs 28 Not Estab. %   Monocytes 11 Not Estab. %   Eos 2 Not Estab. %   Basos 1 Not Estab. %   Neutrophils Absolute 3.0 1.4 - 7.0 x10E3/uL   Lymphocytes Absolute 1.4 0.7 - 3.1 x10E3/uL   Monocytes Absolute 0.6 0.1 - 0.9 x10E3/uL   EOS (ABSOLUTE) 0.1 0.0 - 0.4 x10E3/uL   Basophils Absolute 0.0 0.0 - 0.2 x10E3/uL   Immature Granulocytes 0 Not Estab. %   Immature Grans (Abs) 0.0 0.0 - 0.1 x10E3/uL  Comprehensive metabolic panel   Collection Time: 01/31/23 10:44 AM  Result Value Ref Range   Glucose 91 70 - 99 mg/dL   BUN 12 6 - 24 mg/dL   Creatinine, Ser 9.24 0.57 - 1.00 mg/dL   eGFR 93 >40 fO/fpw/8.26   BUN/Creatinine Ratio 16 9 - 23   Sodium 139 134 - 144 mmol/L   Potassium 3.7 3.5 - 5.2 mmol/L   Chloride 100 96 - 106 mmol/L   CO2 26 20 - 29 mmol/L   Calcium 9.5 8.7 - 10.2 mg/dL   Total Protein 6.7 6.0 - 8.5 g/dL   Albumin 4.5 3.8 - 4.9 g/dL   Globulin, Total 2.2 1.5 - 4.5 g/dL   Bilirubin Total 0.3 0.0 - 1.2 mg/dL   Alkaline Phosphatase 62 44 - 121 IU/L   AST 32 0 - 40 IU/L  ALT 35 (H) 0 - 32 IU/L  Lipid panel   Collection Time: 01/31/23 10:44  AM  Result Value Ref Range   Cholesterol, Total 182 100 - 199 mg/dL   Triglycerides 76 0 - 149 mg/dL   HDL 61 >60 mg/dL   VLDL Cholesterol Cal 14 5 - 40 mg/dL   LDL Chol Calc (NIH) 892 (H) 0 - 99 mg/dL   Chol/HDL Ratio 3.0 0.0 - 4.4 ratio  TSH   Collection Time: 01/31/23 10:44 AM  Result Value Ref Range   TSH 0.965 0.450 - 4.500 uIU/mL  HIV Antibody (routine testing w rflx)   Collection Time: 01/31/23 10:44 AM  Result Value Ref Range   HIV Screen 4th Generation wRfx Non Reactive Non Reactive      Assessment & Plan:   Problem List Items Addressed This Visit       Musculoskeletal and Integument   Osteopenia   Plans to get her updated Dexa scan soon.        Other   Attention deficit hyperactivity disorder (ADHD), predominantly inattentive type   Chronic. Controlled.  Continue with Adderall to 15mg  BID.  Follow up in 3 month.  PDMP checked during visit.  UDS and controlled substance agreement updated at visit today.  Refills sent today.  Call sooner if concerns arise.       Major depressive disorder - Primary   Chronic.  Controlled.  Continue with current medication regimen of sertraline .  Return to clinic in 6 months for reevaluation.  Call sooner if concerns arise.        Controlled substance agreement signed   Relevant Orders   [id number] 11+Oxyco+Alc+Crt-Bund     Follow up plan: No follow-ups on file.

## 2024-02-06 NOTE — Assessment & Plan Note (Signed)
 Plans to get her updated Dexa scan soon.

## 2024-02-06 NOTE — Assessment & Plan Note (Signed)
 Chronic.  Controlled.  Continue with current medication regimen of sertraline .  Return to clinic in 6 months for reevaluation.  Call sooner if concerns arise.

## 2024-02-08 LAB — DRUG SCREEN 764883 11+OXYCO+ALC+CRT-BUND

## 2024-02-10 LAB — DRUG SCREEN 764883 11+OXYCO+ALC+CRT-BUND
BENZODIAZ UR QL: NEGATIVE ng/mL
Barbiturate screen, urine: NEGATIVE ng/mL
Cocaine (Metab.): NEGATIVE ng/mL
Creatinine, Urine: 66.8 mg/dL (ref 20.0–300.0)
Ethanol, Urine: NEGATIVE %
Meperidine: NEGATIVE ng/mL
Methadone Screen, Urine: NEGATIVE ng/mL
Nitrite Urine, Quantitative: NEGATIVE ug/mL
OPIATE SCREEN URINE: NEGATIVE ng/mL
Oxycodone/Oxymorphone, Urine: NEGATIVE ng/mL
PCP Quant, Ur: NEGATIVE ng/mL
Propoxyphene: NEGATIVE ng/mL
Tramadol: NEGATIVE ng/mL
pH, Urine: 5.9 (ref 4.5–8.9)

## 2024-02-10 LAB — CANNABINOID CONFIRMATION, UR
CANNABINOIDS: POSITIVE — AB
Carboxy THC GC/MS Conf: 183 ng/mL

## 2024-02-10 LAB — DRUG PROFILE 799016
Amphetamine GC/MS Conf: >3000 ng/mL
Amphetamine: POSITIVE — AB
Amphetamines: POSITIVE — AB
Methamphetamine: NEGATIVE

## 2024-02-14 LAB — HM PAP SMEAR: HM Pap smear: NEGATIVE

## 2024-03-01 ENCOUNTER — Encounter: Payer: Self-pay | Admitting: Nurse Practitioner

## 2024-03-01 MED ORDER — AMPHETAMINE-DEXTROAMPHET ER 15 MG PO CP24: 15.0000 mg | ORAL_CAPSULE | Freq: Two times a day (BID) | ORAL | 0 refills | Status: DC

## 2024-03-01 MED ORDER — SERTRALINE HCL 100 MG PO TABS: 200.0000 mg | ORAL_TABLET | Freq: Every day | ORAL | 0 refills | Status: AC

## 2024-03-05 ENCOUNTER — Ambulatory Visit
Admission: RE | Admit: 2024-03-05 | Discharge: 2024-03-05 | Disposition: A | Discharge status: Home / Self Care | Source: Ambulatory Visit | Attending: Nurse Practitioner | Admitting: Nurse Practitioner

## 2024-03-05 DIAGNOSIS — Z1231 Encounter for screening mammogram for malignant neoplasm of breast: Secondary | ICD-10-CM | POA: Insufficient documentation

## 2024-04-16 ENCOUNTER — Encounter: Payer: Self-pay | Admitting: Nurse Practitioner

## 2024-04-16 MED ORDER — AMPHETAMINE-DEXTROAMPHET ER 15 MG PO CP24: 15.0000 mg | ORAL_CAPSULE | Freq: Two times a day (BID) | ORAL | 0 refills | Status: DC

## 2024-05-10 ENCOUNTER — Ambulatory Visit: Payer: Self-pay

## 2024-05-10 ENCOUNTER — Ambulatory Visit: Admitting: Physician Assistant

## 2024-05-10 ENCOUNTER — Encounter: Payer: Self-pay | Admitting: Physician Assistant

## 2024-05-10 VITALS — BP 142/74 | HR 93 | Temp 98.4°F | Ht 67.0 in | Wt 119.4 lb

## 2024-05-10 DIAGNOSIS — W19XXXA Unspecified fall, initial encounter: Secondary | ICD-10-CM | POA: Diagnosis not present

## 2024-05-10 DIAGNOSIS — R030 Elevated blood-pressure reading, without diagnosis of hypertension: Secondary | ICD-10-CM | POA: Diagnosis not present

## 2024-05-10 DIAGNOSIS — M25552 Pain in left hip: Secondary | ICD-10-CM | POA: Diagnosis not present

## 2024-05-10 MED ORDER — CYCLOBENZAPRINE HCL 10 MG PO TABS: 10.0000 mg | ORAL_TABLET | Freq: Three times a day (TID) | ORAL | 0 refills | Status: AC | PRN

## 2024-05-10 MED ORDER — MELOXICAM 7.5 MG PO TABS: 7.5000 mg | ORAL_TABLET | Freq: Every day | ORAL | 0 refills | Status: AC

## 2024-05-10 NOTE — Patient Instructions (Signed)
 -  It was a pleasure to see you today! Please review your visit summary for helpful information -I would encourage you to follow your care via MyChart where you can access lab results, notes, messages, and more -If you feel that we did a nice job today, please complete your after-visit survey and leave [place] a Google review! Your CMA today was [place] and your provider was Alvester Morin, PA-C, DMSc

## 2024-05-10 NOTE — Telephone Encounter (Signed)
 FYI Only or Action Required?: FYI only for provider: appointment scheduled on 1/29.  Patient was last seen in primary care on 02/06/2024 by Melvin Pao, NP.  Called Nurse Triage reporting Fall and Hip Pain.  Symptoms began yesterday.  Interventions attempted: OTC medications: Advil.  Symptoms are: gradually worsening.  Triage Disposition: See Physician Within 24 Hours  Patient/caregiver understands and will follow disposition?: Yes     Fell on ice yesterday. Onset of uncomfortable burning sensation in left hip and 4/10 aching pain in right hip. Able to walk but with altered gait to not strain her hips. No obvious breaks, bruises or injury. Small red abrasion left hip. No swelling. No new weakness or numbness. Scheduled appt with provider at different office in pt region today d/t no availability at home office with any provider within timeframe. Advised UC or ED for worsening symptoms.     Message from Kendralyn S sent at 05/10/2024 12:02 PM EST  Reason for Triage: fell yesterday and having hip pain   Reason for Disposition  [1] MODERATE pain (e.g., interferes with normal activities, limping) AND [2] high-risk adult (e.g., age > 60 years, osteoporosis, chronic steroid use)  Answer Assessment - Initial Assessment Questions 1. MECHANISM: How did the injury happen? (e.g., twisting injury, direct blow)      Slipped on ice   2. ONSET: When did the injury happen? (e.g., minutes, hours ago)      Yesterday  3. LOCATION: Where is the injury located?      Both hips  4. APPEARANCE of INJURY: What does the injury look like?  (e.g., deformity of leg)     Appear normal aside from small red abrasion on left hip  5. SEVERITY: Can you put weight on that leg? Can you walk?      Able to walk but with altered gait to to not strain her hips  6. SIZE: For cuts, bruises, or swelling, ask: How large is it? (e.g., inches or centimeters;  entire joint)      Small  7. PAIN:  Is there pain? If Yes, ask: How bad is the pain?   What does it keep you from doing? (Scale 0-10; or none, mild, moderate, severe)     Uncomfortable burning sensation in left hip and 4/10 aching pain in right hip  8. TETANUS: For any breaks in the skin, ask: When was your last tetanus booster?     N/a  9. OTHER SYMPTOMS: Do you have any other symptoms?      Denies  Protocols used: Hip Injury-A-AH

## 2024-05-10 NOTE — Progress Notes (Signed)
 "   Date:  05/10/2024   Name:  Chelsea Velez   DOB:  Sep 10, 1965   MRN:  [id number]   Chief Complaint: Hip Pain (X1 day, one ice, burning sensation, left hip took the impact right hip is hurting as well,used ice helps some but comes backs, getting worse, can't sleep on side, hurts to walk, 7 pain scale, ibuprofen tried )  HPI  Claire is a pleasant 59 y.o. female new to me today for acute evaluation of bilateral hip pain after fall onto ice yesterday. Typically sees my colleague Darice Petty, NP.  Triage note from today as below:   Fell on ice yesterday. Onset of uncomfortable burning sensation in left hip and 4/10 aching pain in right hip. Able to walk but with altered gait to not strain her hips. No obvious breaks, bruises or injury. Small red abrasion left hip. No swelling. No new weakness or numbness.    Patient clarifies that she actually fell several times while trying to get up a hill covered with ice. She does not believe anything to be fractured, though she does endorses a history of osteopenia. She took a single ibuprofen 200 mg which did not seem to help much. The pain is worse today compared to yesterday and she had a hard time sleeping last night due to difficulty finding a comfortable position. She describes the pain as aching but sometimes burning.     Medication list has been reviewed and updated.  Active Medications[1]   Review of Systems  Patient Active Problem List   Diagnosis Date Noted   Osteopenia 11/08/2023   History of colonic polyps 10/26/2023   Closed fracture of distal end of right fibula 09/29/2021   Ankle pain 09/29/2021   Pruritus 09/23/2021   Herpes zoster without complication 11/27/2019   Major depressive disorder 10/05/2019   Controlled substance agreement signed 10/05/2019   Attention deficit hyperactivity disorder (ADHD), predominantly inattentive type 09/18/2019    Allergies[2]  Immunization History  Administered Date(s) Administered    Janssen (J&J) SARS-COV-2 Vaccination 09/24/2019   Zoster Recombinant(Shingrix ) 04/26/2022, 07/30/2022   Zoster, Live 04/11/2013    Past Surgical History:  Procedure Laterality Date   BLADDER SUSPENSION  08/04/2011   Procedure: TRANSVAGINAL TAPE (TVT) PROCEDURE;  Surgeon: Rosaline DELENA Luna, MD;  Location: [facility];  Service: Gynecology;  Laterality: N/A;   BLADDER SUSPENSION N/A    BREAST SURGERY     Right breast excisional biopsy   CESAREAN SECTION  1995   COLONOSCOPY N/A 09/24/2020   Procedure: COLONOSCOPY;  Surgeon: Therisa Bi, MD;  Location: [facility] ENDOSCOPY;  Service: Gastroenterology;  Laterality: N/A;   COLONOSCOPY N/A 10/26/2023   Procedure: COLONOSCOPY;  Surgeon: Marinda Jayson KIDD, MD;  Location: [facility] ENDOSCOPY;  Service: General;  Laterality: N/A;   CYSTOSCOPY  08/04/2011   Procedure: CYSTOSCOPY;  Surgeon: Rosaline DELENA Luna, MD;  Location: [facility];  Service: Gynecology;  Laterality: N/A;   endosocpy     EYE SURGERY     [facility] Teeth Ext      Social History[3]  Family History  Adopted: Yes  Problem Relation Age of Onset   Cervical cancer Mother    Breast cancer Mother    Diabetes Mother    Hypertension Mother    Clotting disorder Mother    Breast cancer Maternal Aunt         05/10/2024    3:03 PM 02/06/2024   10:55 AM 11/08/2023    9:43 AM 08/08/2023    1:17 PM  GAD 7 : Generalized Anxiety Score  Nervous, Anxious, on Edge 0 0  0  0   Control/stop worrying 0 0  0  0   Worry too much - different things 0 0  0  0   Trouble relaxing 0 1  1  1    Restless 0 1  1  1    Easily annoyed or irritable 0 0  1  0   Afraid - awful might happen 0 0  0  0   Total GAD 7 Score 0 2 3 2   Anxiety Difficulty  Not difficult at all Not difficult at all Not difficult at all     Data saved with a previous flowsheet row definition       05/10/2024    3:02 PM 02/06/2024   10:55 AM 11/08/2023    9:43 AM  Depression screen PHQ 2/9  Decreased Interest 1 2 1   Down, Depressed, Hopeless 1 2  3   PHQ - 2 Score 2 4 4   Altered sleeping 0 1 0  Tired, decreased energy 0 3 2  Change in appetite 0 0 0  Feeling bad or failure about yourself  0 1 0  Trouble concentrating 0 1 1  Moving slowly or fidgety/restless 0 1 1  Suicidal thoughts 0 1 3  PHQ-9 Score 2 12  11    Difficult doing work/chores Not difficult at all Somewhat difficult Somewhat difficult     Data saved with a previous flowsheet row definition    BP Readings from Last 3 Encounters:  05/10/24 (!) 142/74  02/06/24 121/68  11/08/23 128/72    Wt Readings from Last 3 Encounters:  05/10/24 119 lb 6.4 oz (54.2 kg)  02/06/24 125 lb 12.8 oz (57.1 kg)  11/08/23 128 lb 6.4 oz (58.2 kg)    BP (!) 142/74   Pulse 93   Temp 98.4 F (36.9 C) (Oral)   Ht 5' 7 (1.702 m)   Wt 119 lb 6.4 oz (54.2 kg)   LMP 10/25/2011   SpO2 99%   BMI 18.70 kg/m   Physical Exam Vitals and nursing note reviewed.  Constitutional:      Appearance: Normal appearance.  Cardiovascular:     Rate and Rhythm: Normal rate.  Pulmonary:     Effort: Pulmonary effort is normal.  Abdominal:     General: There is no distension.  Musculoskeletal:        General: Normal range of motion.     Comments: Gait is antalgic favoring the left leg  Skin:    General: Skin is warm and dry.     Comments: 4 cm area of mild erythema at the proximal left lateral thigh which is TTP but without warmth or ecchymosis. No breaks in skin. Nothing noted on visual inspection of the right side.   Neurological:     Mental Status: She is alert and oriented to person, place, and time.  Psychiatric:        Mood and Affect: Mood and affect normal.     Recent Labs     Component Value Date/Time   NA 139 01/31/2023 1044   K 3.7 01/31/2023 1044   CL 100 01/31/2023 1044   CO2 26 01/31/2023 1044   GLUCOSE 91 01/31/2023 1044   BUN 12 01/31/2023 1044   CREATININE 0.75 01/31/2023 1044   CALCIUM 9.5 01/31/2023 1044   PROT 6.7 01/31/2023 1044   ALBUMIN 4.5 01/31/2023 1044    AST 32 01/31/2023 1044   ALT  35 (H) 01/31/2023 1044   ALKPHOS 62 01/31/2023 1044   BILITOT 0.3 01/31/2023 1044   GFRNONAA 90 11/05/2019 1426   GFRAA 104 11/05/2019 1426    Lab Results  Component Value Date   WBC 5.2 01/31/2023   HGB 13.8 01/31/2023   HCT 42.2 01/31/2023   MCV 103 (H) 01/31/2023   PLT 231 01/31/2023   No results found for: HGBA1C Lab Results  Component Value Date   CHOL 182 01/31/2023   HDL 61 01/31/2023   LDLCALC 107 (H) 01/31/2023   TRIG 76 01/31/2023   CHOLHDL 3.0 01/31/2023   Lab Results  Component Value Date   TSH 0.965 01/31/2023        Assessment & Plan Fall, initial encounter Probable contusion of left thigh. Patient reassured. Would not be surprised if color change in the next 24-48h. Offered XR due to osteopenia but patient declines; low suspicion for fracture.   Treat symptomatically with meloxicam  and cyclobenzaprine  which will hopefully alleviate the pain and help her sleep. Work note written to excuse patient from work 1/31 and 2/1 due to hip pain.   Orders:   meloxicam  (MOBIC ) 7.5 MG tablet; Take 1 tablet (7.5 mg total) by mouth daily. Take with food   cyclobenzaprine  (FLEXERIL ) 10 MG tablet; Take 1 tablet (10 mg total) by mouth 3 (three) times daily as needed for muscle spasms.  Elevated blood pressure reading in office without diagnosis of hypertension Likely secondary to pain. No intervention at this time.      F/u with PCP.    Rolan Hoyle, PA-C, DMSc, DipACLM, Nutritionist [facility] [phone]      [1]  Current Meds  Medication Sig   amphetamine -dextroamphetamine  (ADDERALL XR) 15 MG 24 hr capsule Take 1 capsule by mouth 2 (two) times daily.   amphetamine -dextroamphetamine  (ADDERALL XR) 15 MG 24 hr capsule Take 1 capsule by mouth 2 (two) times daily.   ibuprofen (ADVIL) 400 MG tablet Take 400 mg by mouth every 4 (four) hours.    sertraline  (ZOLOFT ) 100 MG tablet Take 2 tablets (200 mg total) by mouth daily.   valACYclovir  (VALTREX ) 500 MG tablet Take 1 tablet (500 mg total) by mouth daily. As needed  [2] No Known Allergies [3]  Social History Tobacco Use   Smoking status: Former    Current packs/day: 0.00    Average packs/day: 0.3 packs/day for 8.0 years (2.0 ttl pk-yrs)    Types: Cigarettes    Start date: 09/10/1984    Quit date: 09/10/1992    Years since quitting: 31.6   Smokeless tobacco: Never  Vaping Use   Vaping status: Never Used  Substance Use Topics   Alcohol use: Yes    Comment: socially   Drug use: Yes    Types: Marijuana, Amphetamines    Comment: takes Adderal   "

## 2024-05-14 ENCOUNTER — Ambulatory Visit: Admitting: Nurse Practitioner

## 2024-05-15 ENCOUNTER — Encounter: Payer: Self-pay | Admitting: Nurse Practitioner

## 2024-05-15 ENCOUNTER — Ambulatory Visit: Admitting: Nurse Practitioner

## 2024-05-15 VITALS — BP 135/65 | HR 75 | Temp 98.3°F | Resp 15 | Ht 67.01 in | Wt 124.4 lb

## 2024-05-15 DIAGNOSIS — F900 Attention-deficit hyperactivity disorder, predominantly inattentive type: Secondary | ICD-10-CM

## 2024-05-15 MED ORDER — AMPHETAMINE-DEXTROAMPHET ER 15 MG PO CP24: 15.0000 mg | ORAL_CAPSULE | Freq: Two times a day (BID) | ORAL | 0 refills | Status: AC

## 2024-05-15 NOTE — Assessment & Plan Note (Addendum)
 Chronic. Controlled.  Continue with Adderall to 15mg  BID.  Follow up in 3 month.  PDMP checked during visit.  UDS and controlled substance agreement up to date.  Refills sent today.  Call sooner if concerns arise.

## 2024-08-14 ENCOUNTER — Ambulatory Visit: Admitting: Nurse Practitioner
